# Patient Record
Sex: Male | Born: 1965
Health system: Southern US, Community
[De-identification: ages and names within clinical notes are randomized; demographics above are authoritative.]

## PROBLEM LIST (undated history)

## (undated) DIAGNOSIS — I10 Essential (primary) hypertension: Secondary | ICD-10-CM

## (undated) DIAGNOSIS — Z5189 Encounter for other specified aftercare: Secondary | ICD-10-CM

## (undated) DIAGNOSIS — M659 Synovitis and tenosynovitis, unspecified: Secondary | ICD-10-CM

## (undated) DIAGNOSIS — M7022 Olecranon bursitis, left elbow: Secondary | ICD-10-CM

## (undated) DIAGNOSIS — L408 Other psoriasis: Secondary | ICD-10-CM

## (undated) DIAGNOSIS — M5430 Sciatica, unspecified side: Secondary | ICD-10-CM

## (undated) DIAGNOSIS — J309 Allergic rhinitis, unspecified: Secondary | ICD-10-CM

## (undated) DIAGNOSIS — E785 Hyperlipidemia, unspecified: Secondary | ICD-10-CM

## (undated) HISTORY — DX: Olecranon bursitis, left elbow: M70.22

## (undated) HISTORY — PX: LACERATION REPAIR: SHX5168

## (undated) HISTORY — PX: CATARACT EXTRACTION, BILATERAL: SHX1313

## (undated) HISTORY — DX: Other psoriasis: L40.8

## (undated) HISTORY — DX: Sciatica, unspecified side: M54.30

## (undated) HISTORY — DX: Essential (primary) hypertension: I10

## (undated) HISTORY — DX: Hyperlipidemia, unspecified: E78.5

## (undated) HISTORY — PX: OTHER SURGICAL HISTORY: SHX169

## (undated) HISTORY — DX: Allergic rhinitis, unspecified: J30.9

## (undated) HISTORY — DX: Synovitis and tenosynovitis, unspecified: M65.9

## (undated) HISTORY — PX: EYE SURGERY: SHX253

## (undated) HISTORY — DX: Encounter for other specified aftercare: Z51.89

---

## 2004-09-20 ENCOUNTER — Ambulatory Visit: Payer: Self-pay | Admitting: Internal Medicine

## 2004-09-27 ENCOUNTER — Ambulatory Visit: Payer: Self-pay | Admitting: Internal Medicine

## 2004-10-06 ENCOUNTER — Ambulatory Visit: Payer: Self-pay | Admitting: Internal Medicine

## 2004-12-07 ENCOUNTER — Ambulatory Visit: Payer: Self-pay | Admitting: Internal Medicine

## 2005-01-13 ENCOUNTER — Ambulatory Visit: Payer: Self-pay | Admitting: Internal Medicine

## 2005-02-02 ENCOUNTER — Ambulatory Visit: Payer: Self-pay | Admitting: Internal Medicine

## 2005-02-24 ENCOUNTER — Ambulatory Visit: Payer: Self-pay | Admitting: Internal Medicine

## 2005-04-29 ENCOUNTER — Ambulatory Visit: Payer: Self-pay | Admitting: Internal Medicine

## 2005-05-06 ENCOUNTER — Ambulatory Visit: Payer: Self-pay | Admitting: Internal Medicine

## 2005-08-19 ENCOUNTER — Ambulatory Visit: Payer: Self-pay | Admitting: Internal Medicine

## 2006-11-23 ENCOUNTER — Ambulatory Visit: Payer: Self-pay | Admitting: Internal Medicine

## 2006-11-23 LAB — CONVERTED CEMR LAB
ALT: 39 units/L (ref 0–40)
AST: 23 units/L (ref 0–37)
Basophils Relative: 0 % (ref 0.0–1.0)
Bilirubin, Direct: 0.1 mg/dL (ref 0.0–0.3)
CO2: 28 meq/L (ref 19–32)
Chloride: 105 meq/L (ref 96–112)
Direct LDL: 207 mg/dL
Eosinophils Absolute: 0.1 10*3/uL (ref 0.0–0.6)
Eosinophils Relative: 1.6 % (ref 0.0–5.0)
GFR calc non Af Amer: 98 mL/min
Glucose, Bld: 94 mg/dL (ref 70–99)
HCT: 43.9 % (ref 39.0–52.0)
Lymphocytes Relative: 42.5 % (ref 12.0–46.0)
MCV: 96.4 fL (ref 78.0–100.0)
Neutro Abs: 3.2 10*3/uL (ref 1.4–7.7)
Neutrophils Relative %: 46.6 % (ref 43.0–77.0)
RBC: 4.55 M/uL (ref 4.22–5.81)
Sodium: 140 meq/L (ref 135–145)
Total Protein: 6.8 g/dL (ref 6.0–8.3)
VLDL: 33 mg/dL (ref 0–40)
WBC: 6.7 10*3/uL (ref 4.5–10.5)

## 2006-11-30 ENCOUNTER — Ambulatory Visit: Payer: Self-pay | Admitting: Internal Medicine

## 2007-01-26 ENCOUNTER — Ambulatory Visit: Payer: Self-pay | Admitting: Internal Medicine

## 2007-01-26 LAB — CONVERTED CEMR LAB
AST: 24 units/L (ref 0–37)
Albumin: 4 g/dL (ref 3.5–5.2)
Bilirubin, Direct: 0.1 mg/dL (ref 0.0–0.3)
Direct LDL: 119.5 mg/dL
Total Bilirubin: 0.4 mg/dL (ref 0.3–1.2)
Total Protein: 6.5 g/dL (ref 6.0–8.3)

## 2007-02-02 ENCOUNTER — Ambulatory Visit: Payer: Self-pay | Admitting: Internal Medicine

## 2007-06-15 ENCOUNTER — Ambulatory Visit: Payer: Self-pay | Admitting: Internal Medicine

## 2007-10-31 ENCOUNTER — Ambulatory Visit: Payer: Self-pay | Admitting: Internal Medicine

## 2007-10-31 LAB — CONVERTED CEMR LAB
AST: 25 units/L (ref 0–37)
Albumin: 4 g/dL (ref 3.5–5.2)
Alkaline Phosphatase: 51 units/L (ref 39–117)
BUN: 10 mg/dL (ref 6–23)
Basophils Relative: 0.6 % (ref 0.0–1.0)
Blood in Urine, dipstick: NEGATIVE
Chloride: 106 meq/L (ref 96–112)
Creatinine, Ser: 1 mg/dL (ref 0.4–1.5)
Eosinophils Absolute: 0.1 10*3/uL (ref 0.0–0.6)
Eosinophils Relative: 1.3 % (ref 0.0–5.0)
GFR calc Af Amer: 105 mL/min
Glucose, Bld: 87 mg/dL (ref 70–99)
Glucose, Urine, Semiquant: NEGATIVE
HCT: 43.7 % (ref 39.0–52.0)
HDL: 35.5 mg/dL — ABNORMAL LOW (ref >39.0)
Hemoglobin: 14.9 g/dL (ref 13.0–17.0)
MCV: 96.7 fL (ref 78.0–100.0)
Monocytes Absolute: 0.7 10*3/uL (ref 0.2–0.7)
Monocytes Relative: 8.6 % (ref 3.0–11.0)
Nitrite: NEGATIVE
Platelets: 243 10*3/uL (ref 150–400)
Potassium: 5.4 meq/L — ABNORMAL HIGH (ref 3.5–5.1)
RBC: 4.52 M/uL (ref 4.22–5.81)
Specific Gravity, Urine: 1.02
TSH: 0.91 microintl units/mL (ref 0.35–5.50)
Total CHOL/HDL Ratio: 5.5
Total Protein: 6.2 g/dL (ref 6.0–8.3)
WBC Urine, dipstick: NEGATIVE
WBC: 7.8 10*3/uL (ref 4.5–10.5)
pH: 6

## 2007-11-08 ENCOUNTER — Ambulatory Visit: Payer: Self-pay | Admitting: Internal Medicine

## 2007-11-08 DIAGNOSIS — E785 Hyperlipidemia, unspecified: Secondary | ICD-10-CM

## 2007-11-08 DIAGNOSIS — J309 Allergic rhinitis, unspecified: Secondary | ICD-10-CM | POA: Insufficient documentation

## 2007-11-08 HISTORY — DX: Allergic rhinitis, unspecified: J30.9

## 2007-11-08 HISTORY — DX: Hyperlipidemia, unspecified: E78.5

## 2008-01-15 ENCOUNTER — Ambulatory Visit: Payer: Self-pay | Admitting: Internal Medicine

## 2008-01-15 ENCOUNTER — Telehealth: Payer: Self-pay | Admitting: Internal Medicine

## 2008-02-14 ENCOUNTER — Ambulatory Visit: Payer: Self-pay | Admitting: Internal Medicine

## 2008-02-14 DIAGNOSIS — L408 Other psoriasis: Secondary | ICD-10-CM

## 2008-02-14 DIAGNOSIS — L409 Psoriasis, unspecified: Secondary | ICD-10-CM | POA: Insufficient documentation

## 2008-02-14 HISTORY — DX: Other psoriasis: L40.8

## 2009-01-13 ENCOUNTER — Ambulatory Visit: Payer: Self-pay | Admitting: Internal Medicine

## 2009-01-13 LAB — CONVERTED CEMR LAB
Albumin: 4.2 g/dL (ref 3.5–5.2)
Alkaline Phosphatase: 48 units/L (ref 39–117)
Basophils Relative: 0.2 % (ref 0.0–3.0)
CO2: 30 meq/L (ref 19–32)
Chloride: 109 meq/L (ref 96–112)
Eosinophils Absolute: 0.1 10*3/uL (ref 0.0–0.7)
HCT: 40.9 % (ref 39.0–52.0)
Hemoglobin: 14.5 g/dL (ref 13.0–17.0)
Lymphocytes Relative: 29.8 % (ref 12.0–46.0)
Lymphs Abs: 1.8 10*3/uL (ref 0.7–4.0)
MCHC: 35.5 g/dL (ref 30.0–36.0)
MCV: 94.3 fL (ref 78.0–100.0)
Monocytes Absolute: 0.6 10*3/uL (ref 0.1–1.0)
Neutro Abs: 3.4 10*3/uL (ref 1.4–7.7)
Nitrite: NEGATIVE
Potassium: 4.3 meq/L (ref 3.5–5.1)
RBC: 4.34 M/uL (ref 4.22–5.81)
Sodium: 142 meq/L (ref 135–145)
Specific Gravity, Urine: 1.02
Total CHOL/HDL Ratio: 5
Total Protein: 7.4 g/dL (ref 6.0–8.3)
Urobilinogen, UA: 0.2

## 2009-01-19 ENCOUNTER — Ambulatory Visit: Payer: Self-pay | Admitting: Internal Medicine

## 2009-01-19 DIAGNOSIS — M549 Dorsalgia, unspecified: Secondary | ICD-10-CM | POA: Insufficient documentation

## 2009-01-19 DIAGNOSIS — M5430 Sciatica, unspecified side: Secondary | ICD-10-CM

## 2009-01-19 HISTORY — DX: Sciatica, unspecified side: M54.30

## 2009-04-21 ENCOUNTER — Ambulatory Visit: Payer: Self-pay | Admitting: Internal Medicine

## 2009-04-21 LAB — CONVERTED CEMR LAB
ALT: 47 units/L (ref 0–53)
Cholesterol: 225 mg/dL — ABNORMAL HIGH (ref 0–200)
Total Bilirubin: 0.9 mg/dL (ref 0.3–1.2)
Total CHOL/HDL Ratio: 5
Triglycerides: 289 mg/dL — ABNORMAL HIGH (ref 0.0–149.0)

## 2009-04-28 ENCOUNTER — Ambulatory Visit: Payer: Self-pay | Admitting: Internal Medicine

## 2009-07-29 ENCOUNTER — Ambulatory Visit: Payer: Self-pay | Admitting: Internal Medicine

## 2009-07-29 LAB — CONVERTED CEMR LAB
Bilirubin, Direct: 0.1 mg/dL (ref 0.0–0.3)
HDL: 47 mg/dL (ref >39.00)
Total Bilirubin: 1 mg/dL (ref 0.3–1.2)
VLDL: 48.2 mg/dL — ABNORMAL HIGH (ref 0.0–40.0)

## 2010-02-19 ENCOUNTER — Ambulatory Visit: Payer: Self-pay | Admitting: Internal Medicine

## 2010-02-19 LAB — CONVERTED CEMR LAB
AST: 34 units/L (ref 0–37)
BUN: 24 mg/dL — ABNORMAL HIGH (ref 6–23)
Basophils Absolute: 0 10*3/uL (ref 0.0–0.1)
Bilirubin Urine: NEGATIVE
Blood in Urine, dipstick: NEGATIVE
Calcium: 9.5 mg/dL (ref 8.4–10.5)
Cholesterol: 178 mg/dL (ref 0–200)
Eosinophils Absolute: 0.1 10*3/uL (ref 0.0–0.7)
GFR calc non Af Amer: 104.78 mL/min (ref >60)
Glucose, Bld: 104 mg/dL — ABNORMAL HIGH (ref 70–99)
Glucose, Urine, Semiquant: NEGATIVE
HCT: 41.6 % (ref 39.0–52.0)
HDL: 49.4 mg/dL (ref >39.00)
Ketones, urine, test strip: NEGATIVE
Lymphocytes Relative: 44.1 % (ref 12.0–46.0)
Lymphs Abs: 2.6 10*3/uL (ref 0.7–4.0)
Monocytes Relative: 9.4 % (ref 3.0–12.0)
Platelets: 205 10*3/uL (ref 150.0–400.0)
RDW: 12.7 % (ref 11.5–14.6)
TSH: 0.77 microintl units/mL (ref 0.35–5.50)
Total Bilirubin: 0.6 mg/dL (ref 0.3–1.2)
VLDL: 20.8 mg/dL (ref 0.0–40.0)
pH: 5

## 2010-02-26 ENCOUNTER — Ambulatory Visit: Payer: Self-pay | Admitting: Internal Medicine

## 2010-02-26 DIAGNOSIS — M1A00X0 Idiopathic chronic gout, unspecified site, without tophus (tophi): Secondary | ICD-10-CM | POA: Insufficient documentation

## 2010-03-15 ENCOUNTER — Ambulatory Visit: Payer: Self-pay | Admitting: Sports Medicine

## 2010-03-15 DIAGNOSIS — M659 Unspecified synovitis and tenosynovitis, unspecified site: Secondary | ICD-10-CM | POA: Insufficient documentation

## 2010-03-15 HISTORY — DX: Unspecified synovitis and tenosynovitis, unspecified site: M65.90

## 2010-03-15 HISTORY — DX: Synovitis and tenosynovitis, unspecified: M65.9

## 2010-04-26 ENCOUNTER — Ambulatory Visit: Payer: Self-pay | Admitting: Sports Medicine

## 2010-06-07 ENCOUNTER — Ambulatory Visit: Payer: Self-pay | Admitting: Sports Medicine

## 2010-07-19 ENCOUNTER — Telehealth: Payer: Self-pay | Admitting: Internal Medicine

## 2010-07-20 ENCOUNTER — Encounter: Payer: Self-pay | Admitting: Internal Medicine

## 2010-07-20 ENCOUNTER — Ambulatory Visit: Payer: Self-pay | Admitting: Internal Medicine

## 2010-07-20 DIAGNOSIS — I10 Essential (primary) hypertension: Secondary | ICD-10-CM | POA: Insufficient documentation

## 2010-07-20 HISTORY — DX: Essential (primary) hypertension: I10

## 2010-07-20 LAB — CONVERTED CEMR LAB
CO2: 30 meq/L (ref 19–32)
Chloride: 104 meq/L (ref 96–112)
GFR calc non Af Amer: 106.05 mL/min (ref >60.00)
Glucose, Bld: 102 mg/dL — ABNORMAL HIGH (ref 70–99)
Potassium: 4.5 meq/L (ref 3.5–5.1)
Sodium: 141 meq/L (ref 135–145)

## 2010-07-22 ENCOUNTER — Ambulatory Visit: Payer: Self-pay | Admitting: Sports Medicine

## 2010-09-07 NOTE — Assessment & Plan Note (Signed)
Summary: F/U,MC   Vital Signs:  Patient profile:   45 year old male Pulse rate:   79 / minute BP sitting:   144 / 98  (left arm)  Vitals Entered By: Rochele Pages RN (June 07, 2010 8:52 AM) CC: f/u bilateral elbow pain r>l   Primary Provider:  Stacie Glaze MD  CC:  f/u bilateral elbow pain r>l.  History of Present Illness: Elbow Pain: Left, Resolved completly. Able to lift heavy objects without pain.  Much improved.   Right: About 50% improved since last time. Routine activities such as twist a door knob or opening the gas cap of his car no longer cause pain. However lifting and carying heavy objects will cause elbow and forearm pain.  He continues to use NTG patches and his exercise activities as directed. 2lbs on right and 3lbs on left. Occasional pain with weights on right.   Preventive Screening-Counseling & Management  Alcohol-Tobacco     Smoking Status: quit in 2009     Year Quit: 2009     Passive Smoke Exposure: yes  Current Problems (verified): 1)  Elevated Bp Reading Without Dx Hypertension  (ICD-796.2) 2)  Tendinitis, Left Elbow  (ICD-727.00) 3)  Cerumen Impaction, Right  (ICD-380.4) 4)  Chronic Gouty Arthropathy w/o Mention Tophus  (ICD-274.02) 5)  Epicondylitis, Bilateral  (ICD-726.32) 6)  Sciatica, Chronic  (ICD-724.3) 7)  Psoriasis, Scalp  (ICD-696.1) 8)  Gouty Arthropathy  (ICD-274.0) 9)  Allergic Rhinitis  (ICD-477.9) 10)  Hyperlipidemia  (ICD-272.4) 11)  Preventive Health Care  (ICD-V70.0)  Current Medications (verified): 1)  Simvastatin 40 Mg Tabs (Simvastatin) .... One By Mouth Day 2)  Aleve 220 Mg  Caps (Naproxen Sodium) .... As Needed 3)  Nitroglycerin 0.2 Mg/hr Pt24 (Nitroglycerin) .... 1/4 Patch Once Daily To Rt Elbow  Allergies (verified): No Known Drug Allergies  Past History:  Past Medical History: Last updated: 11/08/2007 Hyperlipidemia Allergic rhinitis chickenpox childhood  Past Surgical History: Last updated:  11/08/2007 tendon repair at age 89 laceration repair at 15 ear pinning  Social History: Last updated: 11/08/2007 Married Former Smoker Alcohol use-yes Drug use-no Regular exercise-yes  Risk Factors: Smoking Status: quit in 2009 (06/07/2010) Passive Smoke Exposure: yes (06/07/2010)  Review of Systems  The patient denies anorexia, fever, weight loss, chest pain, and abdominal pain.    Physical Exam  General:  Vs noted and rechecked. Well NAD Msk:  Right Elbow: Normal appearing, no swelling or skin changes.  Normal ROM without pain. Non tender to palpation along the radial head or lateral or medial epicondyls.   Holding a heavy book with an outstretched arm causes pain in the lateral epicondyl.  Additonally resisted wrist extension and ulnar deviation cause some mild pain in lateral epicondyl and along the ulnar forearm.  MSK [place]: Left: Relativly normal appearing for age. Neovessles resolved.   Right: Continued calcifications and hypoechoic changes in the ski slope of the lateral epicondyl tendons. However less than was last time when compaired to prior view. Neovessles are 50% improved from prior study.    Impression & Recommendations:  Problem # 1:  EPICONDYLITIS, BILATERAL (ICD-726.32) Assessment Improved  Improving in right and essentailly resolved on left.  Impression following [place] and exam is improving but not completly healed tennis elbow.  Pain in forearm is likely referred.  Plan to continue PT exercises BL increasing weight to goal of 5lbs. Pain as guide.  Will also continue NTG patches on right as neovessles still present.  Will follow up in  6 weeks. Pt voices understanding.   Orders: [place] LIMITED ([id number])  Problem # 2:  ELEVATED BP READING WITHOUT DX HYPERTENSION (ICD-796.2) Assessment: New BP elevated today and on recheck.  Will have patient go home and perform multiple checks at drug store. Will reccord them. Will follow up in 6 weeks.  red flags  reviewed. Pt voices understanding.   Complete Medication List: 1)  Simvastatin 40 Mg Tabs (Simvastatin) .... One by mouth day 2)  Aleve 220 Mg Caps (Naproxen sodium) .... As needed 3)  Nitroglycerin 0.2 Mg/hr Pt24 (Nitroglycerin) .... 1/4 patch once daily to rt elbow  Patient Instructions: 1)  Thank you for seeing me today. 2)  Please continue the NTG patches as directed.  3)  Continue to advance exercise and weight on left side.  4)  On right keep doing 2 lbs until pain free and then advance to 3lbs.  5)  Let pain be your guide.  >3/10 is too much.  6)  Return in 6 weeks.  7)  Please measure your blood pressure at the pharmacy a few times between now and 6 weeks. Write the numbers down.    Orders Added: 1)  Est. Patient Level III [98119] 2)  [place] LIMITED [[id number]]

## 2010-09-07 NOTE — Letter (Signed)
Summary: *Consult Note  [facility]  [address]   [place], [place] [address]   Phone: [phone]  Fax: [phone]    Re:    Cody Woodard DOB:    1965-12-22 Darryll Capers, MD [facility] 03/15/10   Dear [name]:    Thank you for requesting that we see the above patient for consultation.  A copy of the detailed office note will be sent under separate cover, for your review.  Evaluation today is consistent with:  1)  TENDINITIS, LEFT ELBOW (ICD-727.00) 2)  ELBOW PAIN, BILATERAL (ICD-719.42)   Our recommendation is for: Ntg to treat an area of tearing that has failed to heal in RT lateral epicondyle.  This is thought to bring stem cell migratin to area to affect repair. (Paolini articles are good.)  Will also use a home exercise program for rehabiliatation and follow.  This will likely take 3 to 6 months to heal.   New Orders include:  1)  Consultation Level III [99243] 2)  [place] LIMITED [[id number]]   New Medications started today include:  1)  NITROGLYCERIN 0.2 MG/HR PT24 (NITROGLYCERIN) 1/4 patch once daily to RT elbow   After today's visit, the patients current medications include: 1)  SIMVASTATIN 40 MG TABS (SIMVASTATIN) one by mouth day 2)  ALEVE 220 MG  CAPS (NAPROXEN SODIUM) as needed 3)  NITROGLYCERIN 0.2 MG/HR PT24 (NITROGLYCERIN) 1/4 patch once daily to RT elbow   Thank you for this consultation.  If you have any further questions regarding the care of this patient, please do not hesitate to contact me @ [phone].  Thank you for this opportunity to look after your patient.  Sincerely,  Vincent Gros MD

## 2010-09-07 NOTE — Assessment & Plan Note (Signed)
Summary: NP,B ELBOW PAIN X ONE YEAR   Vital Signs:  Patient profile:   45 year old male BP sitting:   136 / 98  Vitals Entered By: Lillia Pauls CMA (March 15, 2010 10:00 AM)  Primary Provider:  Stacie Glaze MD   History of Present Illness: Cody Woodard is a 45 year old male who presents today with a one year history of elbow pain, right worse than left. He recalls that the pain began after doing a lot of work on his deck. He now feels the pain when he is lifting or carrying something with a flexed elbow and while trying to open jars with the right hand. He localizes the pain in the right elbow to the lateral side and in the left to the posterior aspect. He did receive a corticosteroid injection from his PCP in the right elbow, which helped for about two weeks, but his pain returned with return to activity.  The patient denies any radiating pain, tingling, or numbness. He has not tried any bracing, exercises, or icing. He takes naproxen occasionally for the pain.  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 He has stopped playing golf, which he previously did on occasion. He does not play any other sports.  Left elbow probably had some trauma in sports while young. has Hx of golfball like swelling at olecranon that occurs with too much lifting or activity.  Pain pattern more posterior but did have some lesser pain laterally.  Allergies: No Known Drug Allergies  Physical Exam  General:  Well-developed,well-nourished,in no acute distress; alert,appropriate and cooperative throughout examination Msk:  Right Elbow: No tenderness to palpation over the elbow, nor erythema or edema. Full flexion and extension range of motion. Strength on flexion, extension, supination and pronation are 5/5 though pronation does induce some pain. Cannot hold a book in full extension of arm 2/2 oaun  Left Elbow: No tenderness to palpation over the elbow, nor  erythema or edema. Full flexion and extension range of motion. Strength on flexion, extension, supination and pronation are 5/5 though extension against force does induce some pain.  Right wrist: Full range of motion on flexion and extension. Strength on flexion and extension are 5/5, though extension causes pain at the elbow  Left wrist: Full range of motion on flexion and extension. Strength on flexion and extension are 5/5, without pain.  Hands: finger abduction and adduction strength is 5/5  Additional Exam:  Ultrasound:  Right elbow: 2 bone spurs visible on lateral epicondyle with calcification and scarring in tendon tissue. Neovessel visible on doppler. Edema with a pocket of fluid is visible near lateral epicondyle. Insertion of triceps appears normal.  Left eblow:spur visible at insertion of triceps tendon with some increased blood flow and edema. Olecrenon bursa visualized without enlargement. Lateral epicondyle shows slight irregularity without evidence of significant edema or neovascularization.  Images saved.   Impression & Recommendations:  Problem # 1:  ELBOW PAIN, BILATERAL (ICD-719.42)  The patient presents with bilateral elbow pain of 1 year duration that appears to be secondary to two different processes, a lateral epicondylitis in the right elbow and irritation at the insertion of the triceps tendon on the left. As the right elbow is more painful, we will treat it more aggresively with nitroglycerine patches and an exercise program. The left elbow will be addressed using a triceps exercise program. He is to avoid any activities (eg. golf, tennis) that  exacerbate his pain at this point and is to return in six weeks for re-evaluation.  Orders: [place] LIMITED ([id number])  Problem # 2:  EPICONDYLITIS, BILATERAL (ICD-726.32)  The patient has symptoms, signs and ultrasound evidence of significant chronic lateral epicondylitis, right elbow worse than left. The patient will use  nitroglycerine patches on the right elbow to increase healing and will complete right wrist curls and wrist rolls to strengthen the wrist muscles. Will not use a brace at this point as the patient does not participate in vigerous activity (eg. tennis) that exacerbates the pain. The patient is to reurn in six weeks to evaluate for improvement.  Orders: [place] LIMITED ([id number])  Problem # 3:  TENDINITIS, LEFT ELBOW (ICD-727.00)  The patient's pain in his left elbow is localized to the posterio aspect of the elbow, which is consistent with the spurs found at the insertion of the triceps tendon. We will treat these symptoms using triceps extension exercises. As there was some slight ultrasound evidence of a lateral epicondylitis, the patient is also to do his wrist curls and rolls on the left side (1 set of 15 daily). He is to return to clinic in 6 weeks to evaluate for improvement.  Orders: [place] LIMITED ([id number])  Complete Medication List: 1)  Simvastatin 40 Mg Tabs (Simvastatin) .... One by mouth day 2)  Aleve 220 Mg Caps (Naproxen sodium) .... As needed 3)  Nitroglycerin 0.2 Mg/hr Pt24 (Nitroglycerin) .... 1/4 patch once daily to rt elbow  Patient Instructions: 1)  1. Right side: 2)  1 pound weight in right hand with elbow extended, palm down 3)  wrist curls: down slow up fast 4)  wrist rolls: over slow back fast 5)  3 sets of 15 6)  2. Left side:  7)  1 set of 15 of wrist exercises as above 8)  1 pound weight in left hand with elbow extended 9)  Triceps extension: extend elbow out slow, back curled in fast  10)  3 sets of 15 11)  3. Nitroglycerine patches: use 1/4 over right elbow, change each day 12)  4. Avoid activities that irritate your elbows. 13)  5. Return to clinic in 6 weeks to re-scan and look for improvement. Prescriptions: NITROGLYCERIN 0.2 MG/HR PT24 (NITROGLYCERIN) 1/4 patch once daily to RT elbow  #30 x 1   Entered and Authorized by:   Enid Baas MD   Signed by:   Enid Baas  MD on 03/15/2010   Method used:   Electronically to        [facility] # [id number]* (retail)       [address]       [place], [place]  [address]       Ph: [phone]       Fax: [phone]   RxID:   [id number]

## 2010-09-07 NOTE — Assessment & Plan Note (Signed)
Summary: cpx/mm   Vital Signs:  Patient profile:   45 year old male Height:      72 inches Weight:      205 pounds BMI:     27.90 Temp:     98.2 degrees F oral Pulse rate:   72 / minute Resp:     14 per minute BP sitting:   140 / 90  (left arm)  Vitals Entered By: Willy Eddy, LPN (February 26, 2010 8:32 AM) CC: cpx Is Patient Diabetic? No   Primary Care Provider:  Stacie Glaze MD  CC:  cpx.  History of Present Illness: the pt has been on zocor with good results The pt was asked about all immunizations, health maint. services that are appropriate to their age and was given guidance on diet exercize  and weight management  has persistant bilateral epicodylitis right greater that left had an injection on the right that helped has not worn braces  wax in right ear with eharing loss sebborhea  Preventive Screening-Counseling & Management  Alcohol-Tobacco     Smoking Status: quit in 2009     Year Quit: 2009     Passive Smoke Exposure: yes  Problems Prior to Update: 1)  Sciatica, Chronic  (ICD-724.3) 2)  Psoriasis, Scalp  (ICD-696.1) 3)  Gouty Arthropathy  (ICD-274.0) 4)  Allergic Rhinitis  (ICD-477.9) 5)  Hyperlipidemia  (ICD-272.4) 6)  Preventive Health Care  (ICD-V70.0)  Current Problems (verified): 1)  Sciatica, Chronic  (ICD-724.3) 2)  Psoriasis, Scalp  (ICD-696.1) 3)  Gouty Arthropathy  (ICD-274.0) 4)  Allergic Rhinitis  (ICD-477.9) 5)  Hyperlipidemia  (ICD-272.4) 6)  Preventive Health Care  (ICD-V70.0)  Medications Prior to Update: 1)  Simvastatin 40 Mg Tabs (Simvastatin) .... One By Mouth Day 2)  Aleve 220 Mg  Caps (Naproxen Sodium) .... As Needed  Current Medications (verified): 1)  Simvastatin 40 Mg Tabs (Simvastatin) .... One By Mouth Day 2)  Aleve 220 Mg  Caps (Naproxen Sodium) .... As Needed  Allergies (verified): No Known Drug Allergies  Past History:  Family History: Last updated: 11/08/2007 adopted  Social History: Last  updated: 11/08/2007 Married Former Smoker Alcohol use-yes Drug use-no Regular exercise-yes  Risk Factors: Exercise: yes (11/08/2007)  Risk Factors: Smoking Status: quit in 2009 (02/26/2010) Passive Smoke Exposure: yes (02/26/2010)  Past medical, surgical, family and social histories (including risk factors) reviewed, and no changes noted (except as noted below).  Past Medical History: Reviewed history from 11/08/2007 and no changes required. Hyperlipidemia Allergic rhinitis chickenpox childhood  Past Surgical History: Reviewed history from 11/08/2007 and no changes required. tendon repair at age 81 laceration repair at 11 ear pinning  Family History: Reviewed history from 11/08/2007 and no changes required. adopted  Social History: Reviewed history from 11/08/2007 and no changes required. Married Former Smoker Alcohol use-yes Drug use-no Regular exercise-yes  Review of Systems  The patient denies anorexia, fever, weight loss, weight gain, vision loss, decreased hearing, hoarseness, chest pain, syncope, dyspnea on exertion, peripheral edema, prolonged cough, headaches, hemoptysis, abdominal pain, melena, hematochezia, severe indigestion/heartburn, hematuria, incontinence, genital sores, muscle weakness, suspicious skin lesions, transient blindness, difficulty walking, depression, unusual weight change, abnormal bleeding, enlarged lymph nodes, angioedema, and breast masses.    Physical Exam  General:  Well-developed,well-nourished,in no acute distress; alert,appropriate and cooperative throughout examination Head:  Normocephalic and atraumatic without obvious abnormalities. No apparent alopecia or balding. Eyes:  pupils equal and pupils round.   Ears:  R ear normal and L ear normal.  Nose:  no external deformity and no nasal discharge.   Neck:  No deformities, masses, or tenderness noted. Lungs:  normal respiratory effort, no intercostal retractions, and no  accessory muscle use.   Heart:  normal rate and no murmur.   Abdomen:  soft, non-tender, and no distention.   Rectal:  normal sphincter tone and external hemorrhoid(s).   Prostate:  no gland enlargement and no nodules.   Msk:  no joint swelling and no joint warmth.   Pulses:  R and L carotid,radial,femoral,dorsalis pedis and posterior tibial pulses are full and equal bilaterally Extremities:  No clubbing, cyanosis, edema, or deformity noted with normal full range of motion of all joints.   Neurologic:  alert & oriented X3 and gait normal.   Skin:  Intact without suspicious lesions or rashes Cervical Nodes:  No lymphadenopathy noted Axillary Nodes:  No palpable lymphadenopathy Psych:  Oriented X3.     Impression & Recommendations:  Problem # 1:  EPICONDYLITIS, BILATERAL (ICD-726.32) Assessment New  refer to sports medicine [facility] check gout/ uric acid levels due to relations ship with chronic arthropathies  Orders: Misc. Referral (Misc. Ref)  Problem # 2:  PREVENTIVE HEALTH CARE (ICD-V70.0) The pt was asked about all immunizations, health maint. services that are appropriate to their age and was given guidance on diet exercize  and weight management  Td Booster: Historical (01/06/2001)   Chol: 178 (02/19/2010)   HDL: 49.40 (02/19/2010)   LDL: 108 (02/19/2010)   TG: 104.0 (02/19/2010) TSH: 0.77 (02/19/2010)    Discussed using sunscreen, use of alcohol, drug use, self testicular exam, routine dental care, routine eye care, routine physical exam, seat belts, multiple vitamins, osteoporosis prevention, adequate calcium intake in diet, and recommendations for immunizations.  Discussed exercise and checking cholesterol.  Discussed gun safety, safe sex, and contraception. Also recommend checking PSA.  Problem # 3:  HYPERLIPIDEMIA (ICD-272.4)  His updated medication list for this problem includes:    Simvastatin 40 Mg Tabs (Simvastatin) ..... One by mouth day  Labs Reviewed: SGOT:  34 (02/19/2010)   SGPT: 51 (02/19/2010)   HDL:49.40 (02/19/2010), 47.00 (07/29/2009)  LDL:108 (02/19/2010), 120 (10/31/2007)  Chol:178 (02/19/2010), 202 (07/29/2009)  Trig:104.0 (02/19/2010), 241.0 (07/29/2009)  Problem # 4:  CHRONIC GOUTY ARTHROPATHY W/O MENTION TOPHUS (ICD-274.02) monitering uric acid Orders: Venipuncture ([id number]) TLB-Uric Acid, Blood (84550-URIC)  Elevate extremity; warm compresses, symptomatic relief and medication as directed.   Problem # 5:  CERUMEN IMPACTION, RIGHT (ICD-380.4) informed consnet obtained, using a cerumin spoon the wax impaction was dislodged and the canal was lavaged with 1/2 peroxide and 1/2 warm water solution until clear  Complete Medication List: 1)  Simvastatin 40 Mg Tabs (Simvastatin) .... One by mouth day 2)  Aleve 220 Mg Caps (Naproxen sodium) .... As needed  Patient Instructions: 1)  Please schedule a follow-up appointment in 6 months. Prescriptions: SIMVASTATIN 40 MG TABS (SIMVASTATIN) one by mouth day  #30 x 11   Entered by:   Willy Eddy, LPN   Authorized by:   Stacie Glaze MD   Signed by:   Willy Eddy, LPN on 46/96/2952   Method used:   Electronically to        [facility] # [address]* (retail)       [address]       [place], [place]  [address]       Ph: [phone]       Fax: [phone]   RxID:   [id number]

## 2010-09-07 NOTE — Assessment & Plan Note (Signed)
Summary: U/S  ELBOW,MC   Vital Signs:  Patient profile:   45 year old male BP sitting:   160 / 100  Vitals Entered By: Lillia Pauls CMA (April 26, 2010 8:39 AM)  Primary Provider:  Stacie Glaze MD   History of Present Illness: RT elbow - making progress and about 30% better in regards to pain able to do ecc exercise w 1 lb using NTG without probs on RT only no elbow strap  LT elbow at least 50% better and days w no pain 1 lb is easy on left  Allergies: No Known Drug Allergies  Physical Exam  General:  Well-developed,well-nourished,in no acute distress; alert,appropriate and cooperative throughout examination Msk:  RT elbow full ROM TTP at tip of epi pain with resisted finger or wrist in conc range also in ecc range  Left elbow minimal pain with conc or ecc testing full ROM and some hyperextension has sing of old olecranon bursal swelling no TTP  BOOK - on RT slt pain/ on left no pain Additional Exam:  MSK [place] RT definite improvement in appearance of tendon - less hypechoic calcificatiion is less Neovessels are still present and unchanged  LT tendon looks essentially normal now 1 new neovessel noted some chronic changes as before but less impressive triceps tendon show old avulsion Fx   Impression & Recommendations:  Problem # 1:  EPICONDYLITIS, BILATERAL (ICD-726.32)  cont NTG protocol on RT cont exercises bilat increase to 2 lbs on left and wait to do so on RT until at least 50% better reck 6 wks repeat US then  Orders: [place] LIMITED ([id number])  Complete Medication List: 1)  Simvastatin 40 Mg Tabs (Simvastatin) .... One by mouth day 2)  Aleve 220 Mg Caps (Naproxen sodium) .... As needed 3)  Nitroglycerin 0.2 Mg/hr Pt24 (Nitroglycerin) .... 1/4 patch once daily to rt elbow

## 2010-09-09 NOTE — Assessment & Plan Note (Signed)
Summary: F/U U/S [facility]   Primary Provider:  Stacie Glaze MD  CC:  f/u lateral epicondylitis.  History of Present Illness: [name] for f/u of b/l lateral epicondylitis Symptoms resolved on left, no longer doing exercises.  Not having any pain. Rt side is 50% better from last visit. Doing exercises with 2-lb weight about every other day. Still with pain along Rt lateral elbow & into forearm.   Feels strength is improving. Denies numbness/tingling. Hx of trauma to flexor compartment of Rt forearm when put hand thru window when younger.  Had surgical repair.  Allergies: No Known Drug Allergies  Review of Systems      See HPI  Physical Exam  General:  Well-developed,well-nourished,in no acute distress; alert,appropriate and cooperative throughout examination Msk:  - Lt elbow: FROM without pain.  small amount of swelling of olecranon bursa, area non-tender to palpation.  No erythema.  Minimal TTP over lateral epicondyle, no TTP over medial epicondyle.  No pain at lateral epicondyle with resisted wrist extension or with book test.  - Rt elbow: FROM without pain.  No swelling or bruising.  TTP along lateral epicondyle & along extensor bundle, but less than previous evaluation. Pain at lateral epicondyle with resisted wrist extension, some pain with book test. Pulses:  +2/4 radial b/l Neurologic:  sensation intact to light touch.   Additional Exam:  MSK U/S:  - Left elbow:  tendon appears normal.  Chronic changes as noted on prior scans.  No significant neovessels noted.  No surrounding edema.   - Right elbow: continued calcification along lateral epicondyle with ski slope sign.  Hypoechoic signal at lateral epicondyle consistent with edema.  Less edema than last visit, no visible tears.  Neovessels again present, but less than last scan. Overall improved from previous scan. - Images saved.   Impression & Recommendations:  Problem # 1:  EPICONDYLITIS, BILATERAL  (ICD-726.32) Assessment Improved  - Symptoms resolved on left - Improving on right.  MSK u/s shows decreased neovessels today & overall improvement. - Cont. NTG 1/4 patch on Rt elbow, refill given.  Likely will tx for approach 61-months with NTG. - Cont. exercises on right - emphasized need to do daily. - f/u 2 months for re-evaluation & repeat u/s, may return sooner if needed.  Orders: [place] LIMITED ([id number])  Complete Medication List: 1)  Simvastatin 40 Mg Tabs (Simvastatin) .... One by mouth day 2)  Aleve 220 Mg Caps (Naproxen sodium) .... As needed 3)  Nitroglycerin 0.2 Mg/hr Pt24 (Nitroglycerin) .... 1/4 patch once daily to rt elbow 4)  Bisoprolol Fumarate 5 Mg Tabs (Bisoprolol fumarate) .... 1/2 by mouth daily... moniter blood pressure if readings above  140/90 increased to one whole tablet  Patient Instructions: 1)  Please continue the NTG patches as directed.  2)  Continue exercises on right side - important to do every day. 3)  Follow-up in 38-months. Prescriptions: NITROGLYCERIN 0.2 MG/HR PT24 (NITROGLYCERIN) 1/4 patch once daily to RT elbow  #30 x 1   Entered and Authorized by:   Darene Lamer MD   Signed by:   Darene Lamer MD on 07/22/2010   Method used:   Electronically to        [facility] # [address]* (retail)       [address]       [place], [place]  [address]       Ph: [phone]       Fax: [phone]   RxID:  [id number]    Orders Added: 1)  [place] LIMITED [[id number]] 2)  Est. Patient Level III [08657]

## 2010-09-09 NOTE — Progress Notes (Signed)
Summary: REQUEST FOR APPT?  Phone Note Call from Patient   Caller: Patient  [phone] Summary of Call: Pt called in to schedule an appt with Dr Lovell Sheehan ref HTN concerns.... Pt adv that his b/p has been running around  180/108 .Marland KitchenMarland KitchenMarland KitchenMarland KitchenPt denies any h/a, dizziness.... He does feel anxious from time to time, like his pulse / HR  is pounding.... Pt was offered appt with another physician but he adv he would rather f/u with Dr Lovell Sheehan.... Can you advise date / time that pt can be seen?    # [id number].  Initial call taken by: Debbra Riding,  July 19, 2010 12:29 PM  Follow-up for Phone Call        appointment given Follow-up by: Willy Eddy, LPN,  July 19, 2010 12:37 PM

## 2010-09-09 NOTE — Assessment & Plan Note (Signed)
Summary: elevated bp/bmw   Vital Signs:  Patient profile:   45 year old male Height:      72 inches Weight:      212 pounds BMI:     28.86 Temp:     98.2 degrees F oral Pulse rate:   76 / minute Resp:     14 per minute BP sitting:   160 / 100  (left arm)  Vitals Entered By: Willy Eddy, LPN (July 20, 2010 8:55 AM) CC: c/o elevated blood pessure when seeing orthopedist , Hypertension Management, Lipid Management Is Patient Diabetic? No   Primary Care Provider:  Stacie Glaze MD  CC:  c/o elevated blood pessure when seeing orthopedist , Hypertension Management, and Lipid Management.  History of Present Illness: no hx of head ache , or chest pain some neck pressure feels HR is increased not smoking new diagnosis weigth gain noted after stopping smoking no cough hx of hyperlipidemia  Hypertension History:      He denies headache, chest pain, palpitations, dyspnea with exertion, orthopnea, PND, peripheral edema, visual symptoms, neurologic problems, syncope, and side effects from treatment.  new.        Positive major cardiovascular risk factors include male age 89 years old or older, hyperlipidemia, and hypertension.  Negative major cardiovascular risk factors include no history of diabetes, negative family history for ischemic heart disease, and non-tobacco-user status.        Further assessment for target organ damage reveals no history of ASHD, cardiac end-organ damage (CHF/LVH), stroke/TIA, peripheral vascular disease, renal insufficiency, or hypertensive retinopathy.    Lipid Management History:      Positive NCEP/ATP III risk factors include male age 28 years old or older and hypertension.  Negative NCEP/ATP III risk factors include non-diabetic, no family history for ischemic heart disease, non-tobacco-user status, no ASHD (atherosclerotic heart disease), no prior stroke/TIA, and no peripheral vascular disease.      Preventive Screening-Counseling &  Management  Alcohol-Tobacco     Smoking Status: quit     Year Quit: 2009     Passive Smoke Exposure: yes  Problems Prior to Update: 1)  Elevated Bp Reading Without Dx Hypertension  (ICD-796.2) 2)  Tendinitis, Left Elbow  (ICD-727.00) 3)  Cerumen Impaction, Right  (ICD-380.4) 4)  Chronic Gouty Arthropathy w/o Mention Tophus  (ICD-274.02) 5)  Epicondylitis, Bilateral  (ICD-726.32) 6)  Sciatica, Chronic  (ICD-724.3) 7)  Psoriasis, Scalp  (ICD-696.1) 8)  Gouty Arthropathy  (ICD-274.0) 9)  Allergic Rhinitis  (ICD-477.9) 10)  Hyperlipidemia  (ICD-272.4) 11)  Preventive Health Care  (ICD-V70.0)  Current Problems (verified): 1)  Elevated Bp Reading Without Dx Hypertension  (ICD-796.2) 2)  Tendinitis, Left Elbow  (ICD-727.00) 3)  Cerumen Impaction, Right  (ICD-380.4) 4)  Chronic Gouty Arthropathy w/o Mention Tophus  (ICD-274.02) 5)  Epicondylitis, Bilateral  (ICD-726.32) 6)  Sciatica, Chronic  (ICD-724.3) 7)  Psoriasis, Scalp  (ICD-696.1) 8)  Gouty Arthropathy  (ICD-274.0) 9)  Allergic Rhinitis  (ICD-477.9) 10)  Hyperlipidemia  (ICD-272.4) 11)  Preventive Health Care  (ICD-V70.0)  Medications Prior to Update: 1)  Simvastatin 40 Mg Tabs (Simvastatin) .... One By Mouth Day 2)  Aleve 220 Mg  Caps (Naproxen Sodium) .... As Needed 3)  Nitroglycerin 0.2 Mg/hr Pt24 (Nitroglycerin) .... 1/4 Patch Once Daily To Rt Elbow  Current Medications (verified): 1)  Simvastatin 40 Mg Tabs (Simvastatin) .... One By Mouth Day 2)  Aleve 220 Mg  Caps (Naproxen Sodium) .... As Needed 3)  Nitroglycerin 0.2 Mg/hr  Pt24 (Nitroglycerin) .... 1/4 Patch Once Daily To Rt Elbow  Allergies (verified): No Known Drug Allergies  Past History:  Family History: Last updated: 11/08/2007 adopted  Social History: Last updated: 11/08/2007 Married Former Smoker Alcohol use-yes Drug use-no Regular exercise-yes  Risk Factors: Exercise: yes (11/08/2007)  Risk Factors: Smoking Status: quit  (07/20/2010) Passive Smoke Exposure: yes (07/20/2010)  Past medical, surgical, family and social histories (including risk factors) reviewed, and no changes noted (except as noted below).  Past Medical History: Reviewed history from 11/08/2007 and no changes required. Hyperlipidemia Allergic rhinitis chickenpox childhood  Past Surgical History: Reviewed history from 11/08/2007 and no changes required. tendon repair at age 49 laceration repair at 62 ear pinning  Family History: Reviewed history from 11/08/2007 and no changes required. adopted  Social History: Reviewed history from 11/08/2007 and no changes required. Married Former Smoker Alcohol use-yes Drug use-no Regular exercise-yes Smoking Status:  quit  Review of Systems  The patient denies anorexia, fever, weight loss, weight gain, vision loss, decreased hearing, hoarseness, chest pain, syncope, dyspnea on exertion, peripheral edema, prolonged cough, headaches, hemoptysis, abdominal pain, melena, hematochezia, severe indigestion/heartburn, hematuria, incontinence, genital sores, muscle weakness, suspicious skin lesions, transient blindness, difficulty walking, depression, unusual weight change, abnormal bleeding, enlarged lymph nodes, angioedema, and breast masses.    Physical Exam  General:  Vs noted and rechecked. Well NAD Head:  Normocephalic and atraumatic without obvious abnormalities. No apparent alopecia or balding. Eyes:  pupils equal and pupils round.   Ears:  R ear normal and L ear normal.   Nose:  no external deformity and no nasal discharge.   Neck:  No deformities, masses, or tenderness noted. Chest Wall:  no deformities and pectus excavatum.   Lungs:  normal respiratory effort, no intercostal retractions, and no accessory muscle use.   Heart:  regular rhythm, no murmur, and tachycardia.   Abdomen:  soft, non-tender, and no distention.  no bruits   Impression & Recommendations:  Problem # 1:   HYPERTENSION, ESSENTIAL, UNCONTROLLED (ICD-401.9) Assessment New  new diagnosis will obtain EKG and labs to included TSH, Bmet and  CBC  BP today: 160/100 Prior BP: 144/98 (06/07/2010)  10 Yr Risk Heart Disease: 11 %  Labs Reviewed: K+: 4.6 (02/19/2010) Creat: : 0.8 (02/19/2010)   Chol: 178 (02/19/2010)   HDL: 49.40 (02/19/2010)   LDL: 108 (02/19/2010)   TG: 104.0 (02/19/2010)  Orders: Venipuncture (11914) TLB-BMP (Basic Metabolic Panel-BMET) (80048-METABOL) TLB-TSH (Thyroid Stimulating Hormone) (84443-TSH)  His updated medication list for this problem includes:    Bisoprolol Fumarate 5 Mg Tabs (Bisoprolol fumarate) .Marland Kitchen... 1/2 by mouth daily... moniter blood pressure if readings above  140/90 increased to one whole tablet  Problem # 2:  HYPERLIPIDEMIA (ICD-272.4) Assessment: Unchanged  His updated medication list for this problem includes:    Simvastatin 40 Mg Tabs (Simvastatin) ..... One by mouth day  Labs Reviewed: SGOT: 34 (02/19/2010)   SGPT: 51 (02/19/2010)  Lipid Goals: Chol Goal: 200 (07/20/2010)   HDL Goal: 40 (07/20/2010)   LDL Goal: 130 (07/20/2010)   TG Goal: 150 (07/20/2010)  10 Yr Risk Heart Disease: 11 %   HDL:49.40 (02/19/2010), 47.00 (07/29/2009)  LDL:108 (02/19/2010), 120 (10/31/2007)  Chol:178 (02/19/2010), 202 (07/29/2009)  Trig:104.0 (02/19/2010), 241.0 (07/29/2009)  Orders: TLB-TSH (Thyroid Stimulating Hormone) (84443-TSH)  Complete Medication List: 1)  Simvastatin 40 Mg Tabs (Simvastatin) .... One by mouth day 2)  Aleve 220 Mg Caps (Naproxen sodium) .... As needed 3)  Nitroglycerin 0.2 Mg/hr Pt24 (Nitroglycerin) .... 1/4 patch  once daily to rt elbow 4)  Bisoprolol Fumarate 5 Mg Tabs (Bisoprolol fumarate) .... 1/2 by mouth daily... moniter blood pressure if readings above  140/90 increased to one whole tablet  Hypertension Assessment/Plan:      The patient's hypertensive risk group is category B: At least one risk factor (excluding diabetes) with no  target organ damage.  His calculated 10 year risk of coronary heart disease is 11 %.  Today's blood pressure is 160/100.  His blood pressure goal is < 140/90.  Lipid Assessment/Plan:      Based on NCEP/ATP III, the patient's risk factor category is "2 or more risk factors and a calculated 10 year CAD risk of < 20%".  The patient's lipid goals are as follows: Total cholesterol goal is 200; LDL cholesterol goal is 130; HDL cholesterol goal is 40; Triglyceride goal is 150.    Patient Instructions: 1)  Please schedule a follow-up appointment in 2 months. may use SDA Prescriptions: BISOPROLOL FUMARATE 5 MG TABS (BISOPROLOL FUMARATE) 1/2 by mouth daily... moniter blood pressure if readings above  140/90 increased to one whole tablet  #30 x 2   Entered and Authorized by:   Stacie Glaze MD   Signed by:   Stacie Glaze MD on 07/20/2010   Method used:   Electronically to        [facility] [address] (retail)       [address]       [place], [place]  [address]       Ph: [phone]       Fax: [phone]   RxID:   [id number]    Orders Added: 1)  Est. Patient Level IV [88416] 2)  Venipuncture [60630] 3)  TLB-BMP (Basic Metabolic Panel-BMET) [80048-METABOL] 4)  TLB-TSH (Thyroid Stimulating Hormone) [16010-XNA]  Appended Document: Orders Update    Clinical Lists Changes  Orders: Added new Service order of Specimen Handling ([id number]) - Signed      Appended Document: Orders Update    Clinical Lists Changes  Orders: Added new Service order of EKG w/ Interpretation (93000) - Signed

## 2010-09-13 ENCOUNTER — Ambulatory Visit: Payer: Self-pay | Admitting: Internal Medicine

## 2010-09-16 ENCOUNTER — Encounter: Payer: Self-pay | Admitting: Internal Medicine

## 2010-09-21 ENCOUNTER — Encounter: Payer: Self-pay | Admitting: Internal Medicine

## 2010-09-21 ENCOUNTER — Ambulatory Visit (INDEPENDENT_AMBULATORY_CARE_PROVIDER_SITE_OTHER): Payer: 59 | Admitting: Internal Medicine

## 2010-09-21 VITALS — BP 120/84 | HR 68 | Temp 98.1°F | Resp 14 | Ht 72.0 in | Wt 212.0 lb

## 2010-09-21 DIAGNOSIS — I10 Essential (primary) hypertension: Secondary | ICD-10-CM

## 2010-09-21 DIAGNOSIS — M549 Dorsalgia, unspecified: Secondary | ICD-10-CM

## 2010-09-21 DIAGNOSIS — E785 Hyperlipidemia, unspecified: Secondary | ICD-10-CM

## 2010-09-21 DIAGNOSIS — M659 Synovitis and tenosynovitis, unspecified: Secondary | ICD-10-CM

## 2010-09-21 DIAGNOSIS — Z0000 Encounter for general adult medical examination without abnormal findings: Secondary | ICD-10-CM

## 2010-09-21 NOTE — Assessment & Plan Note (Signed)
Blood pressure control remains at goal review of basic metabolic panel shows a stable creatinine and BUN

## 2010-09-21 NOTE — Assessment & Plan Note (Signed)
Improved continues to stretch hamstrings which help to alleviate the situation

## 2010-09-21 NOTE — Progress Notes (Signed)
  Subjective:    Patient ID: Cody Woodard, male    DOB: Feb 28, 1966, 45 y.o.   MRN: [id number]  HPI  patient is a 45 year old white male who presents for followup of hypertension hyperlipidemia and a history of back pain with radiation to his knees bilaterally.   laboratory values were obtained prior to this office visit for his hyperlipidemia monitoring and his hypertension  monitoring which included a basic metabolic panel liver panel and a lipid panel.   the patient has lost weight has been exercising regularly his blood pressure is well controlled he denies chest pain shortness of breath PND orthopnea or any cardiovascular symptoms.  He has stopped smoking now for approximately one year and has gotten his weight under control     Review of Systems  Constitutional: Negative for fever and fatigue.  HENT: Negative for hearing loss, congestion, neck pain and postnasal drip.   Eyes: Negative for discharge, redness and visual disturbance.  Respiratory: Negative for cough, shortness of breath and wheezing.   Cardiovascular: Negative for leg swelling.  Gastrointestinal: Negative for abdominal pain, constipation and abdominal distention.  Genitourinary: Negative for urgency and frequency.  Musculoskeletal: Negative for joint swelling and arthralgias.  Skin: Negative for color change and rash.  Neurological: Negative for weakness and light-headedness.  Hematological: Negative for adenopathy.  Psychiatric/Behavioral: Negative for behavioral problems.       Objective:   Physical Exam  Constitutional: He is oriented to person, place, and time. He appears well-developed and well-nourished.  HENT:  Head: Normocephalic and atraumatic.  Eyes: Conjunctivae are normal. Pupils are equal, round, and reactive to light.  Neck: Normal range of motion. Neck supple.  Cardiovascular: Normal rate and regular rhythm.   Pulmonary/Chest: Effort normal and breath sounds normal.  Abdominal: Soft. Bowel  sounds are normal.  Musculoskeletal: He exhibits tenderness.  Neurological: He is alert and oriented to person, place, and time.  Skin: Rash noted. There is erythema.        Mild rosacea at the skin folds of the face    I have reviewed this patient's past medical history surgical history social history current medication list and current allergy list and have documented appropriate changes as necessary        Assessment & Plan:   see problem focused exam

## 2010-09-21 NOTE — Assessment & Plan Note (Signed)
Patient continues an active diet plan with weight loss his cholesterol has improved to the best readings that we have recorded specifically his LDL is the lowest that it has been on medications and his triglycerides are now within goal continue current intervention without change.

## 2010-12-17 ENCOUNTER — Other Ambulatory Visit: Payer: Self-pay | Admitting: Internal Medicine

## 2011-02-16 ENCOUNTER — Ambulatory Visit (INDEPENDENT_AMBULATORY_CARE_PROVIDER_SITE_OTHER): Payer: 59 | Admitting: Sports Medicine

## 2011-02-16 ENCOUNTER — Encounter: Payer: Self-pay | Admitting: Sports Medicine

## 2011-02-16 VITALS — BP 138/84 | HR 67 | Ht 72.0 in | Wt 200.0 lb

## 2011-02-16 DIAGNOSIS — M7022 Olecranon bursitis, left elbow: Secondary | ICD-10-CM

## 2011-02-16 DIAGNOSIS — M659 Synovitis and tenosynovitis, unspecified: Secondary | ICD-10-CM

## 2011-02-16 DIAGNOSIS — M7710 Lateral epicondylitis, unspecified elbow: Secondary | ICD-10-CM

## 2011-02-16 DIAGNOSIS — M7020 Olecranon bursitis, unspecified elbow: Secondary | ICD-10-CM

## 2011-02-16 HISTORY — DX: Olecranon bursitis, left elbow: M70.22

## 2011-02-16 NOTE — Assessment & Plan Note (Signed)
He should continue to monitor this. If he gets a flare or signs of swelling he should be evaluated to be sure he is not having a gout attack associated.  He can return to see me when necessary and will continue his primary care with Dr. Lovell Sheehan

## 2011-02-16 NOTE — Assessment & Plan Note (Signed)
We recommended a maintenance exercise program 3 times a week to keep him from getting recurrences. She did get a recurrence he should rest this for a few days use some gentle icing resume his exercises at a lower weight and benefits not resolving we can reevaluate

## 2011-02-16 NOTE — Patient Instructions (Signed)
Triceps - esp on left - 5 lb weight curls slow extension, fast flexion  Continue standard exercise regimen 3 times per week for prevention Grip squeezer 3 sets of 15- 3 times per week Triceps- overhead extension 3 sets 15- 3 times per week    Do ice massage if elbows get sore Return for follow up as needed

## 2011-02-16 NOTE — Assessment & Plan Note (Signed)
There is a chronic calcific tendinitis of the triceps tendon. I suggested adding a few triceps exercises to his regimen on a regular basis to prevent any recurrence of symptoms from this

## 2011-02-16 NOTE — Progress Notes (Signed)
  Subjective:    Patient ID: Cody Woodard, male    DOB: [birth year], 45 y.o.   MRN: [id number]  HPI  Pt presents to clinic for f/u of rt lateral epicondylitis which he states is 99% improved.   Stopped NTG last dec because he was getting skin irritation.  Did HEP daily until 1 month ago, now does exercises occasionally.  Has built up to 5 lbs on the wrist flexion/extension and rolls.  Does not have pain in elbow, is only aware of the area when he does 40 lb curls.  Wanted to f/u to ensure he did not re injure the elbows.      Review of Systems     Objective:   Physical Exam NAD    Lt elbow exam: Chronic olecranon bursal thickening Full extension and flexion Slight tight on supination, loose on pronation Neg book test  Rt elbow exam: Full extension and flexion Excess pronation, slightly limited on supination  Surgical scar rt wrist Neg book test  Musculoskeletal ultrasound Right elbow shows a small avulsion fragment at the tip of the lateral epicondyle There is some slight hypoechoic change at the proximal extensor tendon insertion No abnormal Doppler activity No calcifications No tears Normal triceps tendon  Left elbow shows a very small avulsion Fragment at the tip of the epicondyle There is no hypoechoic change or signs of tearing At the triceps tendon there is significant calcification at the insertion There is a partially calcified olecranon bursa that is thickened but has no abnormal fluid accumulation at this time    Assessment & Plan:

## 2011-03-02 ENCOUNTER — Other Ambulatory Visit: Payer: Self-pay | Admitting: Internal Medicine

## 2011-03-04 ENCOUNTER — Other Ambulatory Visit (INDEPENDENT_AMBULATORY_CARE_PROVIDER_SITE_OTHER): Payer: 59

## 2011-03-04 DIAGNOSIS — Z0000 Encounter for general adult medical examination without abnormal findings: Secondary | ICD-10-CM

## 2011-03-04 LAB — CBC WITH DIFFERENTIAL/PLATELET
Basophils Relative: 0.4 % (ref 0.0–3.0)
Eosinophils Absolute: 0 10*3/uL (ref 0.0–0.7)
Eosinophils Relative: 0.7 % (ref 0.0–5.0)
Hemoglobin: 14.3 g/dL (ref 13.0–17.0)
MCHC: 34.5 g/dL (ref 30.0–36.0)
MCV: 96.3 fl (ref 78.0–100.0)
Monocytes Absolute: 0.4 10*3/uL (ref 0.1–1.0)
Neutro Abs: 2.5 10*3/uL (ref 1.4–7.7)
RBC: 4.3 Mil/uL (ref 4.22–5.81)

## 2011-03-04 LAB — BASIC METABOLIC PANEL
CO2: 28 mEq/L (ref 19–32)
Calcium: 9.6 mg/dL (ref 8.4–10.5)
Creatinine, Ser: 1 mg/dL (ref 0.4–1.5)
GFR: 88.35 mL/min (ref >60.00)
Sodium: 141 mEq/L (ref 135–145)

## 2011-03-04 LAB — LIPID PANEL
HDL: 43.7 mg/dL (ref >39.00)
Triglycerides: 191 mg/dL — ABNORMAL HIGH (ref 0.0–149.0)
VLDL: 38.2 mg/dL (ref 0.0–40.0)

## 2011-03-04 LAB — POCT URINALYSIS DIPSTICK
Bilirubin, UA: NEGATIVE
Blood, UA: NEGATIVE
Glucose, UA: NEGATIVE
Ketones, UA: NEGATIVE
Spec Grav, UA: 1.025

## 2011-03-04 LAB — HEPATIC FUNCTION PANEL
Albumin: 4.7 g/dL (ref 3.5–5.2)
Alkaline Phosphatase: 46 U/L (ref 39–117)
Bilirubin, Direct: 0.1 mg/dL (ref 0.0–0.3)
Total Protein: 7.4 g/dL (ref 6.0–8.3)

## 2011-03-04 LAB — LDL CHOLESTEROL, DIRECT: Direct LDL: 115.1 mg/dL

## 2011-03-08 ENCOUNTER — Telehealth: Payer: Self-pay | Admitting: *Deleted

## 2011-03-08 NOTE — Telephone Encounter (Signed)
Opened in error

## 2011-03-11 ENCOUNTER — Encounter: Payer: Self-pay | Admitting: Internal Medicine

## 2011-03-11 ENCOUNTER — Ambulatory Visit (INDEPENDENT_AMBULATORY_CARE_PROVIDER_SITE_OTHER): Payer: 59 | Admitting: Internal Medicine

## 2011-03-11 VITALS — BP 140/80 | HR 76 | Temp 98.2°F | Resp 16 | Ht 72.0 in | Wt 206.0 lb

## 2011-03-11 DIAGNOSIS — E785 Hyperlipidemia, unspecified: Secondary | ICD-10-CM

## 2011-03-11 DIAGNOSIS — Z23 Encounter for immunization: Secondary | ICD-10-CM

## 2011-03-11 DIAGNOSIS — I10 Essential (primary) hypertension: Secondary | ICD-10-CM

## 2011-03-11 DIAGNOSIS — Z0000 Encounter for general adult medical examination without abnormal findings: Secondary | ICD-10-CM

## 2011-03-11 MED ORDER — BISOPROLOL-HYDROCHLOROTHIAZIDE 5-6.25 MG PO TABS: 1.0000 | ORAL_TABLET | Freq: Every day | ORAL | Status: DC

## 2011-03-11 NOTE — Progress Notes (Signed)
  Subjective:    Patient ID: Cody Woodard, male    DOB: 03-19-1966, 45 y.o.   MRN: [id number]  HPI  Patient presents for complete physical examination and review of chronic problems of hyperlipidemia he also has hypertension that is now better controlled he denies any chest pain shortness of breath PND orthopnea has psoriasis of the scalp was controlled with topical steroids  Review of Systems  Constitutional: Negative for fever and fatigue.  HENT: Negative for hearing loss, congestion, neck pain and postnasal drip.   Eyes: Negative for discharge, redness and visual disturbance.  Respiratory: Negative for cough, shortness of breath and wheezing.   Cardiovascular: Negative for leg swelling.  Gastrointestinal: Negative for abdominal pain, constipation and abdominal distention.  Genitourinary: Negative for urgency and frequency.  Musculoskeletal: Negative for joint swelling and arthralgias.  Skin: Negative for color change and rash.  Neurological: Negative for weakness and light-headedness.  Hematological: Negative for adenopathy.  Psychiatric/Behavioral: Negative for behavioral problems.       Past Medical History  Diagnosis Date  . ALLERGIC RHINITIS 11/08/2007  . CHRONIC GOUTY ARTHROPATHY W/O MENTION TOPHUS 02/26/2010  . Gouty arthropathy 01/15/2008  . HYPERLIPIDEMIA 11/08/2007  . HYPERTENSION, ESSENTIAL, UNCONTROLLED 07/20/2010  . PSORIASIS, SCALP 02/14/2008  . SCIATICA, CHRONIC 01/19/2009  . TENDINITIS, LEFT ELBOW 03/15/2010   Past Surgical History  Procedure Date  . Tendon repari   . Laceration repair   . Ear pinning     reports that he quit smoking about 3 years ago. He has never used smokeless tobacco. He reports that he drinks alcohol. He reports that he does not use illicit drugs. family history is not on file.  He is adopted. No Known Allergies  Objective:   Physical Exam  Nursing note and vitals reviewed. Constitutional: He is oriented to person, place, and time. He  appears well-developed and well-nourished.  HENT:  Head: Normocephalic and atraumatic.  Eyes: Conjunctivae are normal. Pupils are equal, round, and reactive to light.  Neck: Normal range of motion. Neck supple.  Cardiovascular: Normal rate and regular rhythm.   Pulmonary/Chest: Effort normal and breath sounds normal.  Abdominal: Soft. Bowel sounds are normal.  Genitourinary: Rectum normal.  Musculoskeletal: Normal range of motion.  Neurological: He is oriented to person, place, and time. He has normal reflexes.  Skin: Skin is warm and dry.  Psychiatric: He has a normal mood and affect. His behavior is normal.          Assessment & Plan:   Patient presents for yearly preventative medicine examination.   all immunizations and health maintenance protocols were reviewed with the patient and they are up to date with these protocols.   screening laboratory values were reviewed with the patient including screening of hyperlipidemia PSA renal function and hepatic function.   There medications past medical history social history problem list and allergies were reviewed in detail.   Goals were established with regard to weight loss exercise diet in compliance with medications The patient's lipids are not at goal [place] his triglycerides are elevated we discussed the rule of diet and exercise in controlling the triglycerides.  We also reviewed his weight and talk about fluctuation in weight impact on the triglycerides.  The elevation of triglycerides a well as his genetic positive for diabetes increase his risk for both  cardiovascular disease as well as diabetes

## 2011-04-11 ENCOUNTER — Encounter: Payer: Self-pay | Admitting: Internal Medicine

## 2011-07-11 ENCOUNTER — Encounter: Payer: Self-pay | Admitting: Internal Medicine

## 2011-07-11 ENCOUNTER — Ambulatory Visit (INDEPENDENT_AMBULATORY_CARE_PROVIDER_SITE_OTHER): Payer: 59 | Admitting: Internal Medicine

## 2011-07-11 VITALS — BP 130/80 | HR 72 | Temp 98.6°F | Resp 16 | Ht 72.0 in | Wt 215.0 lb

## 2011-07-11 DIAGNOSIS — T887XXA Unspecified adverse effect of drug or medicament, initial encounter: Secondary | ICD-10-CM

## 2011-07-11 DIAGNOSIS — L219 Seborrheic dermatitis, unspecified: Secondary | ICD-10-CM

## 2011-07-11 DIAGNOSIS — M549 Dorsalgia, unspecified: Secondary | ICD-10-CM

## 2011-07-11 DIAGNOSIS — E785 Hyperlipidemia, unspecified: Secondary | ICD-10-CM

## 2011-07-11 DIAGNOSIS — L218 Other seborrheic dermatitis: Secondary | ICD-10-CM

## 2011-07-11 DIAGNOSIS — I10 Essential (primary) hypertension: Secondary | ICD-10-CM

## 2011-07-11 LAB — HEPATIC FUNCTION PANEL
AST: 33 U/L (ref 0–37)
Albumin: 4.6 g/dL (ref 3.5–5.2)
Total Protein: 7.5 g/dL (ref 6.0–8.3)

## 2011-07-11 LAB — LIPID PANEL
HDL: 50.7 mg/dL (ref >39.00)
Total CHOL/HDL Ratio: 4
Triglycerides: 140 mg/dL (ref 0.0–149.0)
VLDL: 28 mg/dL (ref 0.0–40.0)

## 2011-07-11 LAB — LDL CHOLESTEROL, DIRECT: Direct LDL: 115.5 mg/dL

## 2011-07-11 MED ORDER — FLUOCINOLONE ACETONIDE 0.01 % EX OIL: TOPICAL_OIL | Freq: Three times a day (TID) | CUTANEOUS | Status: DC

## 2011-07-11 NOTE — Patient Instructions (Signed)
The patient is instructed to continue all medications as prescribed. Schedule followup with check out clerk upon leaving the clinic  

## 2011-07-11 NOTE — Progress Notes (Signed)
Subjective:    Patient ID: Cody Woodard, male    DOB: [birth year], 45 y.o.   MRN: [id number]  HPI Present for followup of hypertension hyperlipidemia and psoriasis versus seborrhea of the scalp.  He's been to dermatologist who diagnosed it as seborrhea gave him an antifungal as well as a topical steroid foam.  The topical steroid form has been too hard to apply it he has not been using it the fungal shampoo has helped.     Review of Systems  Constitutional: Negative for fever and fatigue.  HENT: Negative for hearing loss, congestion, neck pain and postnasal drip.   Eyes: Negative for discharge, redness and visual disturbance.  Respiratory: Negative for cough, shortness of breath and wheezing.   Cardiovascular: Negative for leg swelling.  Gastrointestinal: Negative for abdominal pain, constipation and abdominal distention.  Genitourinary: Negative for urgency and frequency.  Musculoskeletal: Negative for joint swelling and arthralgias.  Skin: Negative for color change and rash.  Neurological: Negative for weakness and light-headedness.  Hematological: Negative for adenopathy.  Psychiatric/Behavioral: Negative for behavioral problems.       Past Medical History  Diagnosis Date  . ALLERGIC RHINITIS 11/08/2007  . CHRONIC GOUTY ARTHROPATHY W/O MENTION TOPHUS 02/26/2010  . Gouty arthropathy 01/15/2008  . HYPERLIPIDEMIA 11/08/2007  . HYPERTENSION, ESSENTIAL, UNCONTROLLED 07/20/2010  . PSORIASIS, SCALP 02/14/2008  . SCIATICA, CHRONIC 01/19/2009  . TENDINITIS, LEFT ELBOW 03/15/2010    History   Social History  . Marital Status: Married    Spouse Name: N/A    Number of Children: N/A  . Years of Education: N/A   Occupational History  . Not on file.   Social History Main Topics  . Smoking status: Former Smoker    Quit date: 08/09/2007  . Smokeless tobacco: Never Used  . Alcohol Use: Yes  . Drug Use: No  . Sexually Active: Not on file   Other Topics Concern  . Not on file    Social History Narrative  . No narrative on file    Past Surgical History  Procedure Date  . Tendon repari   . Laceration repair   . Ear pinning     Family History  Problem Relation Age of Onset  . Adopted: Yes    No Known Allergies  Current Outpatient Prescriptions on File Prior to Visit  Medication Sig Dispense Refill  . bisoprolol-hydrochlorothiazide (ZIAC) 5-6.25 MG per tablet Take 1 tablet by mouth daily.  30 tablet  11  . mometasone (ELOCON) 0.1 % cream Apply topically daily. Use as needed        . naproxen sodium (ANAPROX) 220 MG tablet Take 220 mg by mouth as needed.        . simvastatin (ZOCOR) 40 MG tablet TAKE ONE TABLET BY MOUTH ONE TIME DAILY  30 tablet  10    BP 130/80  Pulse 72  Temp 98.6 F (37 C)  Resp 16  Ht 6' (1.829 m)  Wt 215 lb (97.523 kg)  BMI 29.16 kg/m2    Objective:   Physical Exam  Nursing note and vitals reviewed. Constitutional: He appears well-developed and well-nourished.  HENT:  Head: Normocephalic and atraumatic.  Eyes: Conjunctivae are normal. Pupils are equal, round, and reactive to light.  Neck: Normal range of motion. Neck supple.  Cardiovascular: Normal rate and regular rhythm.   Pulmonary/Chest: Effort normal and breath sounds normal.  Abdominal: Soft. Bowel sounds are normal.          Assessment & Plan:  Patient has  controlled hypertension his current medications.  He has hyperlipidemia and a lipid panel and liver will be drawn today.  He has scalp dermatitis that is felt to be a seborrheic type dermatitis and we have given him a prescription for FS oil

## 2011-11-14 ENCOUNTER — Ambulatory Visit (INDEPENDENT_AMBULATORY_CARE_PROVIDER_SITE_OTHER): Payer: 59 | Admitting: Family

## 2011-11-14 ENCOUNTER — Encounter: Payer: Self-pay | Admitting: Family

## 2011-11-14 DIAGNOSIS — M109 Gout, unspecified: Secondary | ICD-10-CM

## 2011-11-14 DIAGNOSIS — M79676 Pain in unspecified toe(s): Secondary | ICD-10-CM

## 2011-11-14 DIAGNOSIS — M79609 Pain in unspecified limb: Secondary | ICD-10-CM

## 2011-11-14 MED ORDER — INDOMETHACIN 25 MG PO CAPS: 25.0000 mg | ORAL_CAPSULE | Freq: Two times a day (BID) | ORAL | Status: AC

## 2011-11-14 MED ORDER — METHYLPREDNISOLONE ACETATE 80 MG/ML IJ SUSP: 80.0000 mg | Freq: Once | INTRAMUSCULAR | Status: DC

## 2011-11-14 NOTE — Patient Instructions (Signed)
Gout Gout is an inflammatory condition (arthritis) caused by a buildup of uric acid crystals in the joints. Uric acid is a chemical that is normally present in the blood. Under some circumstances, uric acid can form into crystals in your joints. This causes joint redness, soreness, and swelling (inflammation). Repeat attacks are common. Over time, uric acid crystals can form into masses (tophi) near a joint, causing disfigurement. Gout is treatable and often preventable. CAUSES  The disease begins with elevated levels of uric acid in the blood. Uric acid is produced by your body when it breaks down a naturally found substance called purines. This also happens when you eat certain foods such as meats and fish. Causes of an elevated uric acid level include:  Being passed down from parent to child (heredity).   Diseases that cause increased uric acid production (obesity, psoriasis, some cancers).   Excessive alcohol use.   Diet, especially diets rich in meat and seafood.   Medicines, including certain cancer-fighting drugs (chemotherapy), diuretics, and aspirin.   Chronic kidney disease. The kidneys are no longer able to remove uric acid well.   Problems with metabolism.  Conditions strongly associated with gout include:  Obesity.   High blood pressure.   High cholesterol.   Diabetes.  Not everyone with elevated uric acid levels gets gout. It is not understood why some people get gout and others do not. Surgery, joint injury, and eating too much of certain foods are some of the factors that can lead to gout. SYMPTOMS   An attack of gout comes on quickly. It causes intense pain with redness, swelling, and warmth in a joint.   Fever can occur.   Often, only one joint is involved. Certain joints are more commonly involved:   Base of the big toe.   Knee.   Ankle.   Wrist.   Finger.  Without treatment, an attack usually goes away in a few days to weeks. Between attacks, you  usually will not have symptoms, which is different from many other forms of arthritis. DIAGNOSIS  Your caregiver will suspect gout based on your symptoms and exam. Removal of fluid from the joint (arthrocentesis) is done to check for uric acid crystals. Your caregiver will give you a medicine that numbs the area (local anesthetic) and use a needle to remove joint fluid for exam. Gout is confirmed when uric acid crystals are seen in joint fluid, using a special microscope. Sometimes, blood, urine, and X-ray tests are also used. TREATMENT  There are 2 phases to gout treatment: treating the sudden onset (acute) attack and preventing attacks (prophylaxis). Treatment of an Acute Attack  Medicines are used. These include anti-inflammatory medicines or steroid medicines.   An injection of steroid medicine into the affected joint is sometimes necessary.   The painful joint is rested. Movement can worsen the arthritis.   You may use warm or cold treatments on painful joints, depending which works best for you.   Discuss the use of coffee, vitamin C, or cherries with your caregiver. These may be helpful treatment options.  Treatment to Prevent Attacks After the acute attack subsides, your caregiver may advise prophylactic medicine. These medicines either help your kidneys eliminate uric acid from your body or decrease your uric acid production. You may need to stay on these medicines for a very long time. The early phase of treatment with prophylactic medicine can be associated with an increase in acute gout attacks. For this reason, during the first few months   of treatment, your caregiver may also advise you to take medicines usually used for acute gout treatment. Be sure you understand your caregiver's directions. You should also discuss dietary treatment with your caregiver. Certain foods such as meats and fish can increase uric acid levels. Other foods such as dairy can decrease levels. Your caregiver  can give you a list of foods to avoid. HOME CARE INSTRUCTIONS   Do not take aspirin to relieve pain. This raises uric acid levels.   Only take over-the-counter or prescription medicines for pain, discomfort, or fever as directed by your caregiver.   Rest the joint as much as possible. When in bed, keep sheets and blankets off painful areas.   Keep the affected joint raised (elevated).   Use crutches if the painful joint is in your leg.   Drink enough water and fluids to keep your urine clear or pale yellow. This helps your body get rid of uric acid. Do not drink alcoholic beverages. They slow the passage of uric acid.   Follow your caregiver's dietary instructions. Pay careful attention to the amount of protein you eat. Your daily diet should emphasize fruits, vegetables, whole grains, and fat-free or low-fat milk products.   Maintain a healthy body weight.  SEEK MEDICAL CARE IF:   You have an oral temperature above 102 F (38.9 C).   You develop diarrhea, vomiting, or any side effects from medicines.   You do not feel better in 24 hours, or you are getting worse.  SEEK IMMEDIATE MEDICAL CARE IF:   Your joint becomes suddenly more tender and you have:   Chills.   An oral temperature above 102 F (38.9 C), not controlled by medicine.  MAKE SURE YOU:   Understand these instructions.   Will watch your condition.   Will get help right away if you are not doing well or get worse.  Document Released: 07/22/2000 Document Revised: 07/14/2011 Document Reviewed: 11/02/2009 [employer] Patient Information 2012 [employer]. 

## 2011-11-14 NOTE — Progress Notes (Signed)
Subjective:    Patient ID: Cody Woodard, male    DOB: 1965-08-27, 46 y.o.   MRN: [id number]  HPI Comments: 46 yo white male presents with c/o gout flare up left foot started Fri, pain 9/10 described as sharp, aching. Pain goes down to 5/10 with naproxen and ice. Pain gets worse with shoe and ambulation.      Review of Systems  Constitutional: Negative.   Respiratory: Negative.   Cardiovascular: Negative.   Musculoskeletal: Positive for joint swelling.       Left great toe swelling.   Skin:       Left great toe red, and tender  Hematological: Negative.   Psychiatric/Behavioral: Negative.    Past Medical History  Diagnosis Date  . ALLERGIC RHINITIS 11/08/2007  . CHRONIC GOUTY ARTHROPATHY W/O MENTION TOPHUS 02/26/2010  . Gouty arthropathy 01/15/2008  . HYPERLIPIDEMIA 11/08/2007  . HYPERTENSION, ESSENTIAL, UNCONTROLLED 07/20/2010  . PSORIASIS, SCALP 02/14/2008  . SCIATICA, CHRONIC 01/19/2009  . TENDINITIS, LEFT ELBOW 03/15/2010    History   Social History  . Marital Status: Married    Spouse Name: N/A    Number of Children: N/A  . Years of Education: N/A   Occupational History  . Not on file.   Social History Main Topics  . Smoking status: Former Smoker    Quit date: 08/09/2007  . Smokeless tobacco: Never Used  . Alcohol Use: Yes  . Drug Use: No  . Sexually Active: Not on file   Other Topics Concern  . Not on file   Social History Narrative  . No narrative on file    Past Surgical History  Procedure Date  . Tendon repari   . Laceration repair   . Ear pinning     Family History  Problem Relation Age of Onset  . Adopted: Yes    No Known Allergies  Current Outpatient Prescriptions on File Prior to Visit  Medication Sig Dispense Refill  . bisoprolol-hydrochlorothiazide (ZIAC) 5-6.25 MG per tablet Take 1 tablet by mouth daily.  30 tablet  11  . fluocinolone (DERMA-SMOOTHE/FS SCALP) 0.01 % external oil Apply topically 3 (three) times daily.  120 mL  0  .  mometasone (ELOCON) 0.1 % cream Apply topically daily. Use as needed        . naproxen sodium (ANAPROX) 220 MG tablet Take 220 mg by mouth as needed.        . simvastatin (ZOCOR) 40 MG tablet TAKE ONE TABLET BY MOUTH ONE TIME DAILY  30 tablet  10   No current facility-administered medications on file prior to visit.    BP 142/88  Temp(Src) 98.3 F (36.8 C) (Oral)  Wt 213 lb (96.616 kg)chart     Objective:   Physical Exam  Constitutional: He is oriented to person, place, and time. He appears well-developed and well-nourished.  Neck: Normal range of motion. Neck supple.  Cardiovascular: Normal rate, regular rhythm, normal heart sounds and intact distal pulses.  Exam reveals no gallop and no friction rub.   No murmur heard. Pulmonary/Chest: Effort normal and breath sounds normal. No respiratory distress. He has no wheezes. He has no rales. He exhibits no tenderness.  Musculoskeletal: He exhibits tenderness.       Left great toe. Painful with ROM  Neurological: He is alert and oriented to person, place, and time.  Skin: Skin is warm and dry. He is not diaphoretic. There is erythema.          Left great toe  Psychiatric:  He has a normal mood and affect.          Assessment & Plan:  Assessment: Gout Plan: Indocin, depo-medrol. Take indocin with food and report any black tarry stools immediately to clinic. Teaching handouts on diagnosis and medications provided. Encouraged to RTC if pain gets worse

## 2012-01-30 ENCOUNTER — Ambulatory Visit (INDEPENDENT_AMBULATORY_CARE_PROVIDER_SITE_OTHER): Payer: 59 | Admitting: Family

## 2012-01-30 ENCOUNTER — Telehealth: Payer: Self-pay | Admitting: Internal Medicine

## 2012-01-30 ENCOUNTER — Encounter: Payer: Self-pay | Admitting: Family

## 2012-01-30 VITALS — BP 136/90 | Wt 207.0 lb

## 2012-01-30 DIAGNOSIS — IMO0002 Reserved for concepts with insufficient information to code with codable children: Secondary | ICD-10-CM

## 2012-01-30 DIAGNOSIS — M25579 Pain in unspecified ankle and joints of unspecified foot: Secondary | ICD-10-CM

## 2012-01-30 DIAGNOSIS — M25473 Effusion, unspecified ankle: Secondary | ICD-10-CM

## 2012-01-30 MED ORDER — PREDNISONE 20 MG PO TABS: ORAL_TABLET | ORAL | Status: AC

## 2012-01-30 MED ORDER — HYDROCODONE-ACETAMINOPHEN 7.5-750 MG PO TABS: 1.0000 | ORAL_TABLET | Freq: Three times a day (TID) | ORAL | Status: AC | PRN

## 2012-01-30 MED ORDER — KETOROLAC TROMETHAMINE 60 MG/2ML IM SOLN
60.0000 mg | Freq: Once | INTRAMUSCULAR | Status: AC
  Administered 2012-01-30: 60 mg via INTRAMUSCULAR

## 2012-01-30 NOTE — Addendum Note (Signed)
Addended byAdline Mango B on: 01/30/2012 03:56 PM   Modules accepted: Orders

## 2012-01-30 NOTE — Telephone Encounter (Signed)
Added uric acid to cpx labs

## 2012-01-30 NOTE — Patient Instructions (Signed)
Gout Gout is an inflammatory condition (arthritis) caused by a buildup of uric acid crystals in the joints. Uric acid is a chemical that is normally present in the blood. Under some circumstances, uric acid can form into crystals in your joints. This causes joint redness, soreness, and swelling (inflammation). Repeat attacks are common. Over time, uric acid crystals can form into masses (tophi) near a joint, causing disfigurement. Gout is treatable and often preventable. CAUSES  The disease begins with elevated levels of uric acid in the blood. Uric acid is produced by your body when it breaks down a naturally found substance called purines. This also happens when you eat certain foods such as meats and fish. Causes of an elevated uric acid level include:  Being passed down from parent to child (heredity).   Diseases that cause increased uric acid production (obesity, psoriasis, some cancers).   Excessive alcohol use.   Diet, especially diets rich in meat and seafood.   Medicines, including certain cancer-fighting drugs (chemotherapy), diuretics, and aspirin.   Chronic kidney disease. The kidneys are no longer able to remove uric acid well.   Problems with metabolism.  Conditions strongly associated with gout include:  Obesity.   High blood pressure.   High cholesterol.   Diabetes.  Not everyone with elevated uric acid levels gets gout. It is not understood why some people get gout and others do not. Surgery, joint injury, and eating too much of certain foods are some of the factors that can lead to gout. SYMPTOMS   An attack of gout comes on quickly. It causes intense pain with redness, swelling, and warmth in a joint.   Fever can occur.   Often, only one joint is involved. Certain joints are more commonly involved:   Base of the big toe.   Knee.   Ankle.   Wrist.   Finger.  Without treatment, an attack usually goes away in a few days to weeks. Between attacks, you  usually will not have symptoms, which is different from many other forms of arthritis. DIAGNOSIS  Your caregiver will suspect gout based on your symptoms and exam. Removal of fluid from the joint (arthrocentesis) is done to check for uric acid crystals. Your caregiver will give you a medicine that numbs the area (local anesthetic) and use a needle to remove joint fluid for exam. Gout is confirmed when uric acid crystals are seen in joint fluid, using a special microscope. Sometimes, blood, urine, and X-ray tests are also used. TREATMENT  There are 2 phases to gout treatment: treating the sudden onset (acute) attack and preventing attacks (prophylaxis). Treatment of an Acute Attack  Medicines are used. These include anti-inflammatory medicines or steroid medicines.   An injection of steroid medicine into the affected joint is sometimes necessary.   The painful joint is rested. Movement can worsen the arthritis.   You may use warm or cold treatments on painful joints, depending which works best for you.   Discuss the use of coffee, vitamin C, or cherries with your caregiver. These may be helpful treatment options.  Treatment to Prevent Attacks After the acute attack subsides, your caregiver may advise prophylactic medicine. These medicines either help your kidneys eliminate uric acid from your body or decrease your uric acid production. You may need to stay on these medicines for a very long time. The early phase of treatment with prophylactic medicine can be associated with an increase in acute gout attacks. For this reason, during the first few months   of treatment, your caregiver may also advise you to take medicines usually used for acute gout treatment. Be sure you understand your caregiver's directions. You should also discuss dietary treatment with your caregiver. Certain foods such as meats and fish can increase uric acid levels. Other foods such as dairy can decrease levels. Your caregiver  can give you a list of foods to avoid. HOME CARE INSTRUCTIONS   Do not take aspirin to relieve pain. This raises uric acid levels.   Only take over-the-counter or prescription medicines for pain, discomfort, or fever as directed by your caregiver.   Rest the joint as much as possible. When in bed, keep sheets and blankets off painful areas.   Keep the affected joint raised (elevated).   Use crutches if the painful joint is in your leg.   Drink enough water and fluids to keep your urine clear or pale yellow. This helps your body get rid of uric acid. Do not drink alcoholic beverages. They slow the passage of uric acid.   Follow your caregiver's dietary instructions. Pay careful attention to the amount of protein you eat. Your daily diet should emphasize fruits, vegetables, whole grains, and fat-free or low-fat milk products.   Maintain a healthy body weight.  SEEK MEDICAL CARE IF:   You have an oral temperature above 102 F (38.9 C).   You develop diarrhea, vomiting, or any side effects from medicines.   You do not feel better in 24 hours, or you are getting worse.  SEEK IMMEDIATE MEDICAL CARE IF:   Your joint becomes suddenly more tender and you have:   Chills.   An oral temperature above 102 F (38.9 C), not controlled by medicine.  MAKE SURE YOU:   Understand these instructions.   Will watch your condition.   Will get help right away if you are not doing well or get worse.  Document Released: 07/22/2000 Document Revised: 07/14/2011 Document Reviewed: 11/02/2009 [employer] Patient Information 2012 [employer]. 

## 2012-01-30 NOTE — Telephone Encounter (Signed)
Pt called and wants to make sure that Uric Acid test is added to his cpx labs in August. Pt is coming in today to see Adline Mango re: gout flare up.

## 2012-01-30 NOTE — Progress Notes (Signed)
Subjective:    Patient ID: Cody Woodard, male    DOB: Jan 04, 1966, 45 y.o.   MRN: [id number]  HPI 46 year old white male, nonsmoker is in with complaints of right ankle pain x2 days. He had a flare similar in April 2013. Was treated with anti-inflammatory medications and eventually got better. Reports taken about one month for him to be comfortably put on a sneaker. The sclerae typically happen after exercising extensively. His intake and medicine with no relief. Had left over Vicodin from a tooth extraction that has helped his symptoms. He's changed his diet to decrease it intake a purine rich foods.   Review of Systems  Constitutional: Negative.   Respiratory: Negative.   Cardiovascular: Negative.   Musculoskeletal: Positive for joint swelling and arthralgias.       Right ankle swollen, tender and minimally red.   Skin: Negative.   Neurological: Negative.   Hematological: Negative.   Psychiatric/Behavioral: Negative.    Past Medical History  Diagnosis Date  . ALLERGIC RHINITIS 11/08/2007  . CHRONIC GOUTY ARTHROPATHY W/O MENTION TOPHUS 02/26/2010  . Gouty arthropathy 01/15/2008  . HYPERLIPIDEMIA 11/08/2007  . HYPERTENSION, ESSENTIAL, UNCONTROLLED 07/20/2010  . PSORIASIS, SCALP 02/14/2008  . SCIATICA, CHRONIC 01/19/2009  . TENDINITIS, LEFT ELBOW 03/15/2010    History   Social History  . Marital Status: Married    Spouse Name: N/A    Number of Children: N/A  . Years of Education: N/A   Occupational History  . Not on file.   Social History Main Topics  . Smoking status: Former Smoker    Quit date: 08/09/2007  . Smokeless tobacco: Never Used  . Alcohol Use: Yes  . Drug Use: No  . Sexually Active: Not on file   Other Topics Concern  . Not on file   Social History Narrative  . No narrative on file    Past Surgical History  Procedure Date  . Tendon repari   . Laceration repair   . Ear pinning     Family History  Problem Relation Age of Onset  . Adopted: Yes    No  Known Allergies  Current Outpatient Prescriptions on File Prior to Visit  Medication Sig Dispense Refill  . bisoprolol-hydrochlorothiazide (ZIAC) 5-6.25 MG per tablet Take 1 tablet by mouth daily.  30 tablet  11  . fluocinolone (DERMA-SMOOTHE/FS SCALP) 0.01 % external oil Apply topically 3 (three) times daily.  120 mL  0  . indomethacin (INDOCIN) 25 MG capsule Take 1 capsule (25 mg total) by mouth 2 (two) times daily with a meal. 2 caps po TID x 48 hours then 1 tab po TID until pain resolves.  40 capsule  1  . mometasone (ELOCON) 0.1 % cream Apply topically daily. Use as needed        . naproxen sodium (ANAPROX) 220 MG tablet Take 220 mg by mouth as needed.        . simvastatin (ZOCOR) 40 MG tablet TAKE ONE TABLET BY MOUTH ONE TIME DAILY  30 tablet  10   Current Facility-Administered Medications on File Prior to Visit  Medication Dose Route Frequency Provider Last Rate Last Dose  . methylPREDNISolone acetate (DEPO-MEDROL) injection 80 mg  80 mg Intramuscular Once Baker Pierini, FNP        BP 136/90  Wt 207 lb (93.895 kg)chart    Objective:   Physical Exam  Constitutional: He is oriented to person, place, and time. He appears well-developed and well-nourished.  HENT:  Right Ear: External ear  normal.  Left Ear: External ear normal.  Nose: Nose normal.  Mouth/Throat: Oropharynx is clear and moist.  Cardiovascular: Normal rate, regular rhythm and normal heart sounds.   Pulmonary/Chest: Effort normal and breath sounds normal.  Abdominal: Soft. Bowel sounds are normal.  Musculoskeletal:       Right ankle minimally red, moderately tender to touch. Pain with range of motion. Pulses 2 out of 2. Warm to touch. Moderate swelling to the ankle.  Neurological: He is alert and oriented to person, place, and time.  Skin: Skin is warm and dry.  Psychiatric: He has a normal mood and affect.     Toradol 60 mg IM x1.     Assessment & Plan:  Assessment: Gout versus osteoarthritis of the  right ankle  Plan: At last office visit patient did not have uric as a level drawn. Therefore, we will draw uric acid level today. If normal, would consider an x-ray of the right ankle. Prednisone 60x3, 40x3, 20x3. Vicodin when necessary. Patient call the office if symptoms worsen or persist. Recheck a schedule, when necessary.

## 2012-01-31 ENCOUNTER — Telehealth: Payer: Self-pay | Admitting: Family

## 2012-01-31 ENCOUNTER — Ambulatory Visit (INDEPENDENT_AMBULATORY_CARE_PROVIDER_SITE_OTHER)
Admission: RE | Admit: 2012-01-31 | Discharge: 2012-01-31 | Disposition: A | Discharge status: Home / Self Care | Payer: 59 | Source: Ambulatory Visit | Attending: Family | Admitting: Family

## 2012-01-31 DIAGNOSIS — IMO0002 Reserved for concepts with insufficient information to code with codable children: Secondary | ICD-10-CM

## 2012-01-31 DIAGNOSIS — M25579 Pain in unspecified ankle and joints of unspecified foot: Secondary | ICD-10-CM

## 2012-01-31 DIAGNOSIS — M25476 Effusion, unspecified foot: Secondary | ICD-10-CM

## 2012-01-31 DIAGNOSIS — M25473 Effusion, unspecified ankle: Secondary | ICD-10-CM

## 2012-01-31 NOTE — Telephone Encounter (Signed)
Patient reports significant improvement in pain of his right ankle. Advised to consider Allopurinol or Uloric for management of Gout. He would like to see Dr. Lovell Sheehan to further discuss. Appointment made by Bland Span.

## 2012-02-09 ENCOUNTER — Other Ambulatory Visit: Payer: Self-pay | Admitting: Internal Medicine

## 2012-02-15 ENCOUNTER — Ambulatory Visit (INDEPENDENT_AMBULATORY_CARE_PROVIDER_SITE_OTHER): Payer: 59 | Admitting: Internal Medicine

## 2012-02-15 ENCOUNTER — Encounter: Payer: Self-pay | Admitting: Internal Medicine

## 2012-02-15 VITALS — BP 120/82 | HR 68 | Temp 98.2°F | Resp 16 | Ht 72.0 in | Wt 208.0 lb

## 2012-02-15 DIAGNOSIS — M109 Gout, unspecified: Secondary | ICD-10-CM

## 2012-02-15 DIAGNOSIS — M1189 Other specified crystal arthropathies, multiple sites: Secondary | ICD-10-CM

## 2012-02-15 DIAGNOSIS — M130 Polyarthritis, unspecified: Secondary | ICD-10-CM

## 2012-02-15 DIAGNOSIS — M1120 Other chondrocalcinosis, unspecified site: Secondary | ICD-10-CM

## 2012-02-15 LAB — URIC ACID: Uric Acid, Serum: 7.6 mg/dL (ref 4.0–7.8)

## 2012-02-15 MED ORDER — COLCHICINE 0.6 MG PO TABS: 0.6000 mg | ORAL_TABLET | Freq: Every day | ORAL | Status: DC

## 2012-02-15 MED ORDER — METHYLPREDNISOLONE (PAK) 4 MG PO TABS: 4.0000 mg | ORAL_TABLET | Freq: Every day | ORAL | Status: DC

## 2012-02-15 NOTE — Patient Instructions (Addendum)
The patient is instructed to continue all medications as prescribed. Schedule followup with check out clerk upon leaving the clinic Gout Gout is an inflammatory condition (arthritis) caused by a buildup of uric acid crystals in the joints. Uric acid is a chemical that is normally present in the blood. Under some circumstances, uric acid can form into crystals in your joints. This causes joint redness, soreness, and swelling (inflammation). Repeat attacks are common. Over time, uric acid crystals can form into masses (tophi) near a joint, causing disfigurement. Gout is treatable and often preventable. CAUSES  The disease begins with elevated levels of uric acid in the blood. Uric acid is produced by your body when it breaks down a naturally found substance called purines. This also happens when you eat certain foods such as meats and fish. Causes of an elevated uric acid level include:  Being passed down from parent to child (heredity).   Diseases that cause increased uric acid production (obesity, psoriasis, some cancers).   Excessive alcohol use.  Medicines, including certain cancer-fighting drugs (chemotherapy), diuretics, and aspirin.   Chronic kidney disease. The kidneys are no longer able to remove uric acid well.   Problems with metabolism.  Conditions strongly associated with gout include:  Obesity.   High blood pressure.   High cholesterol.   Diabetes.  Not everyone with elevated uric acid levels gets gout. It is not understood why some people get gout and others do not. Surgery, joint injury, and eating too much of certain foods are some of the factors that can lead to gout. SYMPTOMS   An attack of gout comes on quickly. It causes intense pain with redness, swelling, and warmth in a joint.   Fever can occur.   Often, only one joint is involved. Certain joints are more commonly involved:   Base of the big toe.   Knee.   Ankle.   Wrist.   Finger.  Without  treatment, an attack usually goes away in a few days to weeks. Between attacks, you usually will not have symptoms, which is different from many other forms of arthritis. DIAGNOSIS  Your caregiver will suspect gout based on your symptoms and exam. Removal of fluid from the joint (arthrocentesis) is done to check for uric acid crystals. Your caregiver will give you a medicine that numbs the area (local anesthetic) and use a needle to remove joint fluid for exam. Gout is confirmed when uric acid crystals are seen in joint fluid, using a special microscope. Sometimes, blood, urine, and X-ray tests are also used. TREATMENT  There are 2 phases to gout treatment: treating the sudden onset (acute) attack and preventing attacks (prophylaxis). Treatment of an Acute Attack  Medicines are used. These include anti-inflammatory medicines or steroid medicines.   An injection of steroid medicine into the affected joint is sometimes necessary.   The painful joint is rested. Movement can worsen the arthritis.   You may use warm or cold treatments on painful joints, depending which works best for you.   Discuss the use of coffee, vitamin C, or cherries with your caregiver. These may be helpful treatment options.  Treatment to Prevent Attacks After the acute attack subsides, your caregiver may advise prophylactic medicine. These medicines either help your kidneys eliminate uric acid from your body or decrease your uric acid production. You may need to stay on these medicines for a very long time. The early phase of treatment with prophylactic medicine can be associated with an increase in acute gout  attacks. For this reason, during the first few months of treatment, your caregiver may also advise you to take medicines usually used for acute gout treatment. Be sure you understand your caregiver's directions. You should also discuss dietary treatment with your caregiver. Certain foods such as meats and fish can  increase uric acid levels. Other foods such as dairy can decrease levels. Your caregiver can give you a list of foods to avoid. HOME CARE INSTRUCTIONS   Do not take aspirin to relieve pain. This raises uric acid levels.   Only take over-the-counter or prescription medicines for pain, discomfort, or fever as directed by your caregiver.   Rest the joint as much as possible. When in bed, keep sheets and blankets off painful areas.   Keep the affected joint raised (elevated).   Use crutches if the painful joint is in your leg.   Drink enough water and fluids to keep your urine clear or pale yellow. This helps your body get rid of uric acid. Do not drink alcoholic beverages. They slow the passage of uric acid.   Follow your caregiver's dietary instructions. Pay careful attention to the amount of protein you eat. Your daily diet should emphasize fruits, vegetables, whole grains, and fat-free or low-fat milk products.   Maintain a healthy body weight.  SEEK MEDICAL CARE IF:   You have an oral temperature above 102 F (38.9 C).   You develop diarrhea, vomiting, or any side effects from medicines.   You do not feel better in 24 hours, or you are getting worse.  SEEK IMMEDIATE MEDICAL CARE IF:   Your joint becomes suddenly more tender and you have:   Chills.   An oral temperature above 102 F (38.9 C), not controlled by medicine.  MAKE SURE YOU:   Understand these instructions.   Will watch your condition.   Will get help right away if you are not doing well or get worse.  Document Released: 07/22/2000 Document Revised: 07/14/2011 Document Reviewed: 11/02/2009 [facility] Patient Information 2012 [place], [place].   Take the colchicine daily for one month then as needed for a gouty attack at the first sign that he notices stiff or swollen joint take colchicine twice a day for 2-3 days if the joint responds to continue the colchicine daily for a two-week of the joint does not respond  then begin the Medrol Dosepak.

## 2012-02-15 NOTE — Progress Notes (Signed)
Subjective:    Patient ID: Cody Woodard, male    DOB: 1965/08/20, 46 y.o.   MRN: [id number]  HPI The patient had a severe gout attack in the left foot for about 30 days, had a recurent attack in opposite foot about two weeks later. The patient responded to a prednisone burst and taper. No significant hx of gout. Has increased pain in many joints. The precipitant has been exercise ( golf or mowing)   Review of Systems  Constitutional: Negative for fever and fatigue.  HENT: Negative for hearing loss, congestion, neck pain and postnasal drip.   Eyes: Negative for discharge, redness and visual disturbance.  Respiratory: Negative for cough, shortness of breath and wheezing.   Cardiovascular: Negative for leg swelling.  Gastrointestinal: Negative for abdominal pain, constipation and abdominal distention.  Genitourinary: Negative for urgency and frequency.  Musculoskeletal: Positive for myalgias and joint swelling. Negative for arthralgias.  Skin: Negative for color change and rash.  Neurological: Negative for weakness and light-headedness.  Hematological: Negative for adenopathy.  Psychiatric/Behavioral: Negative for behavioral problems.   Past Medical History  Diagnosis Date  . ALLERGIC RHINITIS 11/08/2007  . CHRONIC GOUTY ARTHROPATHY W/O MENTION TOPHUS 02/26/2010  . Gouty arthropathy 01/15/2008  . HYPERLIPIDEMIA 11/08/2007  . HYPERTENSION, ESSENTIAL, UNCONTROLLED 07/20/2010  . PSORIASIS, SCALP 02/14/2008  . SCIATICA, CHRONIC 01/19/2009  . TENDINITIS, LEFT ELBOW 03/15/2010    History   Social History  . Marital Status: Married    Spouse Name: N/A    Number of Children: N/A  . Years of Education: N/A   Occupational History  . Not on file.   Social History Main Topics  . Smoking status: Former Smoker    Quit date: 08/09/2007  . Smokeless tobacco: Never Used  . Alcohol Use: Yes  . Drug Use: No  . Sexually Active: Not on file   Other Topics Concern  . Not on file   Social  History Narrative  . No narrative on file    Past Surgical History  Procedure Date  . Tendon repari   . Laceration repair   . Ear pinning     Family History  Problem Relation Age of Onset  . Adopted: Yes    No Known Allergies  Current Outpatient Prescriptions on File Prior to Visit  Medication Sig Dispense Refill  . bisoprolol-hydrochlorothiazide (ZIAC) 5-6.25 MG per tablet Take 1 tablet by mouth daily.  30 tablet  11  . fluocinolone (DERMA-SMOOTHE/FS SCALP) 0.01 % external oil Apply topically 3 (three) times daily.  120 mL  0  . mometasone (ELOCON) 0.1 % cream Apply topically daily. Use as needed        . naproxen sodium (ANAPROX) 220 MG tablet Take 220 mg by mouth as needed.        . simvastatin (ZOCOR) 40 MG tablet TAKE ONE TABLET BY MOUTH ONE TIME DAILY  30 tablet  9   Current Facility-Administered Medications on File Prior to Visit  Medication Dose Route Frequency Provider Last Rate Last Dose  . methylPREDNISolone acetate (DEPO-MEDROL) injection 80 mg  80 mg Intramuscular Once Baker Pierini, FNP        BP 120/82  Pulse 68  Temp 98.2 F (36.8 C)  Resp 16  Ht 6' (1.829 m)  Wt 208 lb (94.348 kg)  BMI 28.21 kg/m2       Objective:   Physical Exam  Nursing note and vitals reviewed. Constitutional: He appears well-developed and well-nourished.  HENT:  Head: Normocephalic and atraumatic.  Eyes: Conjunctivae are normal. Pupils are equal, round, and reactive to light.  Neck: Normal range of motion. Neck supple.  Cardiovascular: Normal rate and regular rhythm.   Pulmonary/Chest: Effort normal and breath sounds normal.  Abdominal: Soft. Bowel sounds are normal.  Musculoskeletal: He exhibits edema and tenderness.  Skin: Skin is warm and dry.          Assessment & Plan:  I believe that the patient's symptoms are more consistent with pseudogout therefore a colchicine prophylaxis is most probable we best effective plan we'll begin colchicine 0.6 mg by mouth  daily for 30 day trial then he will use it episodically when necessary joint swelling.  We'll look a uric acid again today we will also he look at an ANA and a rheumatoid factor to make sure that this does not represent a antibody mediated disease.  Patient does have a family history of gout patient states that x-rays have been taken in the past but orthopedist who had suggested pseudogout

## 2012-02-16 LAB — RHEUMATOID FACTOR: Rhuematoid fact SerPl-aCnc: <10 IU/mL (ref <=14)

## 2012-02-16 LAB — ANA: Anti Nuclear Antibody(ANA): NEGATIVE

## 2012-03-09 ENCOUNTER — Other Ambulatory Visit (INDEPENDENT_AMBULATORY_CARE_PROVIDER_SITE_OTHER): Payer: 59

## 2012-03-09 DIAGNOSIS — Z0000 Encounter for general adult medical examination without abnormal findings: Secondary | ICD-10-CM

## 2012-03-09 LAB — BASIC METABOLIC PANEL
BUN: 18 mg/dL (ref 6–23)
Calcium: 9.5 mg/dL (ref 8.4–10.5)
Chloride: 104 mEq/L (ref 96–112)
Creatinine, Ser: 1 mg/dL (ref 0.4–1.5)
GFR: 90.1 mL/min (ref >60.00)

## 2012-03-09 LAB — HEPATIC FUNCTION PANEL
ALT: 83 U/L — ABNORMAL HIGH (ref 0–53)
Alkaline Phosphatase: 43 U/L (ref 39–117)
Bilirubin, Direct: 0.1 mg/dL (ref 0.0–0.3)
Total Bilirubin: 0.7 mg/dL (ref 0.3–1.2)
Total Protein: 6.8 g/dL (ref 6.0–8.3)

## 2012-03-09 LAB — LIPID PANEL
Cholesterol: 188 mg/dL (ref 0–200)
LDL Cholesterol: 105 mg/dL — ABNORMAL HIGH (ref 0–99)
Total CHOL/HDL Ratio: 4
Triglycerides: 179 mg/dL — ABNORMAL HIGH (ref 0.0–149.0)
VLDL: 35.8 mg/dL (ref 0.0–40.0)

## 2012-03-09 LAB — URIC ACID: Uric Acid, Serum: 10 mg/dL — ABNORMAL HIGH (ref 4.0–7.8)

## 2012-03-09 LAB — CBC WITH DIFFERENTIAL/PLATELET
Basophils Relative: 0.5 % (ref 0.0–3.0)
Eosinophils Relative: 0.8 % (ref 0.0–5.0)
Lymphocytes Relative: 42.9 % (ref 12.0–46.0)
Monocytes Absolute: 0.5 10*3/uL (ref 0.1–1.0)
Neutrophils Relative %: 44.2 % (ref 43.0–77.0)
Platelets: 206 10*3/uL (ref 150.0–400.0)
RBC: 4.14 Mil/uL — ABNORMAL LOW (ref 4.22–5.81)
WBC: 4.5 10*3/uL (ref 4.5–10.5)

## 2012-03-09 LAB — POCT URINALYSIS DIPSTICK
Blood, UA: NEGATIVE
Glucose, UA: NEGATIVE
Nitrite, UA: NEGATIVE
Protein, UA: NEGATIVE
Spec Grav, UA: 1.02
Urobilinogen, UA: 1

## 2012-03-10 ENCOUNTER — Other Ambulatory Visit: Payer: Self-pay | Admitting: Internal Medicine

## 2012-03-16 ENCOUNTER — Encounter: Payer: 59 | Admitting: Internal Medicine

## 2012-03-20 ENCOUNTER — Encounter: Payer: Self-pay | Admitting: Internal Medicine

## 2012-03-20 ENCOUNTER — Ambulatory Visit (INDEPENDENT_AMBULATORY_CARE_PROVIDER_SITE_OTHER): Payer: 59 | Admitting: Internal Medicine

## 2012-03-20 VITALS — BP 136/80 | HR 68 | Temp 98.2°F | Resp 14 | Ht 72.0 in | Wt 210.0 lb

## 2012-03-20 DIAGNOSIS — L719 Rosacea, unspecified: Secondary | ICD-10-CM

## 2012-03-20 DIAGNOSIS — Z0000 Encounter for general adult medical examination without abnormal findings: Secondary | ICD-10-CM

## 2012-03-20 DIAGNOSIS — E785 Hyperlipidemia, unspecified: Secondary | ICD-10-CM

## 2012-03-20 DIAGNOSIS — R7401 Elevation of levels of liver transaminase levels: Secondary | ICD-10-CM

## 2012-03-20 DIAGNOSIS — R748 Abnormal levels of other serum enzymes: Secondary | ICD-10-CM

## 2012-03-20 DIAGNOSIS — M109 Gout, unspecified: Secondary | ICD-10-CM

## 2012-03-20 LAB — HEPATIC FUNCTION PANEL
Albumin: 4.4 g/dL (ref 3.5–5.2)
Alkaline Phosphatase: 43 U/L (ref 39–117)
Total Bilirubin: 0.6 mg/dL (ref 0.3–1.2)

## 2012-03-20 LAB — URIC ACID: Uric Acid, Serum: 8.1 mg/dL — ABNORMAL HIGH (ref 4.0–7.8)

## 2012-03-20 MED ORDER — CLINDAMYCIN PHOSPHATE 1 % EX LOTN: TOPICAL_LOTION | Freq: Two times a day (BID) | CUTANEOUS | Status: AC

## 2012-03-20 NOTE — Addendum Note (Signed)
Addended by: Stacie Glaze on: 03/20/2012 10:00 AM   Modules accepted: Orders

## 2012-03-20 NOTE — Patient Instructions (Addendum)
The patient is instructed to continue all medications as prescribed. Schedule followup with check out clerk upon leaving the clinic  Patient has moderately elevated liver enzymes. We will recheck today to see if the month of daily colchicine was be a factor in this case.  If the liver functions are normal then we will use colchicine only episodically and continue to monitor.  If the liver functions remain elevated it may be a combination of colchicine Naprosyn Zocor and alcohol and we will have to modify one of those risk factors. We may decide to decrease the Zocor by adding a second agent.

## 2012-03-20 NOTE — Addendum Note (Signed)
Addended by: Stacie Glaze on: 03/20/2012 10:01 AM   Modules accepted: Orders

## 2012-03-20 NOTE — Progress Notes (Signed)
Subjective:    Patient ID: Cody Woodard, male    DOB: [birth year], 46 y.o.   MRN: [id number]  HPI CPX Persistant rosacia vs psoriais Using the topical cream every day     Review of Systems  Constitutional: Negative for fever and fatigue.  HENT: Negative for hearing loss, congestion, neck pain and postnasal drip.   Eyes: Negative for discharge, redness and visual disturbance.  Respiratory: Negative for cough, shortness of breath and wheezing.   Cardiovascular: Negative for leg swelling.  Gastrointestinal: Negative for abdominal pain, constipation and abdominal distention.  Genitourinary: Negative for urgency and frequency.  Musculoskeletal: Negative for joint swelling and arthralgias.  Skin: Negative for color change and rash.  Neurological: Negative for weakness and light-headedness.  Hematological: Negative for adenopathy.  Psychiatric/Behavioral: Negative for behavioral problems.   Past Medical History  Diagnosis Date  . ALLERGIC RHINITIS 11/08/2007  . CHRONIC GOUTY ARTHROPATHY W/O MENTION TOPHUS 02/26/2010  . Gouty arthropathy 01/15/2008  . HYPERLIPIDEMIA 11/08/2007  . HYPERTENSION, ESSENTIAL, UNCONTROLLED 07/20/2010  . PSORIASIS, SCALP 02/14/2008  . SCIATICA, CHRONIC 01/19/2009  . TENDINITIS, LEFT ELBOW 03/15/2010    History   Social History  . Marital Status: Married    Spouse Name: N/A    Number of Children: N/A  . Years of Education: N/A   Occupational History  . Not on file.   Social History Main Topics  . Smoking status: Former Smoker    Quit date: 08/09/2007  . Smokeless tobacco: Never Used  . Alcohol Use: Yes  . Drug Use: No  . Sexually Active: Not on file   Other Topics Concern  . Not on file   Social History Narrative  . No narrative on file    Past Surgical History  Procedure Date  . Tendon repari   . Laceration repair   . Ear pinning     Family History  Problem Relation Age of Onset  . Adopted: Yes    No Known Allergies  Current  Outpatient Prescriptions on File Prior to Visit  Medication Sig Dispense Refill  . bisoprolol-hydrochlorothiazide (ZIAC) 5-6.25 MG per tablet TAKE ONE TABLET BY MOUTH ONE TIME DAILY  30 tablet  10  . mometasone (ELOCON) 0.1 % cream Apply topically daily. Use as needed        . naproxen sodium (ANAPROX) 220 MG tablet Take 220 mg by mouth as needed.        . simvastatin (ZOCOR) 40 MG tablet TAKE ONE TABLET BY MOUTH ONE TIME DAILY  30 tablet  9  . DISCONTD: colchicine (COLCRYS) 0.6 MG tablet Take 1 tablet (0.6 mg total) by mouth daily.  30 tablet  11   Current Facility-Administered Medications on File Prior to Visit  Medication Dose Route Frequency Provider Last Rate Last Dose  . DISCONTD: methylPREDNISolone acetate (DEPO-MEDROL) injection 80 mg  80 mg Intramuscular Once Baker Pierini, FNP        BP 136/80  Pulse 68  Temp 98.2 F (36.8 C)  Resp 14  Ht 6' (1.829 m)  Wt 210 lb (95.255 kg)  BMI 28.48 kg/m2       Objective:   Physical Exam  Nursing note and vitals reviewed. Constitutional: He is oriented to person, place, and time. He appears well-developed and well-nourished.  HENT:  Head: Normocephalic and atraumatic.  Eyes: Conjunctivae are normal. Pupils are equal, round, and reactive to light.  Neck: Normal range of motion. Neck supple.  Cardiovascular: Normal rate and regular rhythm.   Pulmonary/Chest: Effort  normal and breath sounds normal.  Abdominal: Soft. Bowel sounds are normal.  Genitourinary: Rectum normal and prostate normal.  Musculoskeletal: Normal range of motion.  Neurological: He is alert and oriented to person, place, and time.  Skin: Rash noted. There is erythema.          Assessment & Plan:   Patient presents for yearly preventative medicine examination.   all immunizations and health maintenance protocols were reviewed with the patient and they are up to date with these protocols.   screening laboratory values were reviewed with the patient  including screening of hyperlipidemia PSA renal function and hepatic function.   There medications past medical history social history problem list and allergies were reviewed in detail.   Goals were established with regard to weight loss exercise diet in compliance with medications   Moderately elevated liver functions there is a history of slight bumps in liver function some early over the past several years felt like the last slight bump in liver functions may have been due to a trial of Crestor.  Discomfort liver functions may be attributed to the daily use of colchicine for one month for gout a combination.  We'll monitor liver functions today if they have normalized then we can attribute to rise to colchicine if they have not resolved they may be multifactorial interaction between his current medicine profile and adjusting the statin would be the best first move.

## 2012-03-26 ENCOUNTER — Other Ambulatory Visit: Payer: Self-pay | Admitting: Internal Medicine

## 2012-04-05 ENCOUNTER — Other Ambulatory Visit: Payer: Self-pay | Admitting: *Deleted

## 2012-04-05 MED ORDER — FEBUXOSTAT 40 MG PO TABS: 80.0000 mg | ORAL_TABLET | Freq: Every day | ORAL | Status: DC

## 2012-06-12 ENCOUNTER — Telehealth: Payer: Self-pay | Admitting: Internal Medicine

## 2012-06-12 ENCOUNTER — Other Ambulatory Visit: Payer: Self-pay | Admitting: *Deleted

## 2012-06-12 MED ORDER — FEBUXOSTAT 40 MG PO TABS: 80.0000 mg | ORAL_TABLET | Freq: Every day | ORAL | Status: DC

## 2012-06-12 NOTE — Telephone Encounter (Signed)
Per dr Lovell Sheehan- may have uloric 40 1qd for 1 month- pt informed

## 2012-06-12 NOTE — Telephone Encounter (Signed)
Pt would like to begin taking uloric now. He is having a gout flare up.

## 2012-06-13 ENCOUNTER — Ambulatory Visit (INDEPENDENT_AMBULATORY_CARE_PROVIDER_SITE_OTHER): Payer: 59 | Admitting: Family

## 2012-06-13 ENCOUNTER — Encounter: Payer: Self-pay | Admitting: Family

## 2012-06-13 ENCOUNTER — Ambulatory Visit: Payer: 59 | Admitting: Family

## 2012-06-13 VITALS — BP 138/88 | HR 77 | Wt 208.0 lb

## 2012-06-13 DIAGNOSIS — M109 Gout, unspecified: Secondary | ICD-10-CM

## 2012-06-13 DIAGNOSIS — Z23 Encounter for immunization: Secondary | ICD-10-CM

## 2012-06-13 MED ORDER — COLCHICINE 0.6 MG PO TABS: 0.6000 mg | ORAL_TABLET | Freq: Every day | ORAL | Status: DC | PRN

## 2012-06-13 MED ORDER — METHYLPREDNISOLONE ACETATE 40 MG/ML IJ SUSP
80.0000 mg | Freq: Once | INTRAMUSCULAR | Status: AC
  Administered 2012-06-13: 80 mg via INTRAMUSCULAR

## 2012-06-13 MED ORDER — FEBUXOSTAT 40 MG PO TABS: 80.0000 mg | ORAL_TABLET | Freq: Every day | ORAL | Status: DC

## 2012-06-13 NOTE — Progress Notes (Signed)
Subjective:    Patient ID: Cody Woodard, male    DOB: [birth year], 46 y.o.   MRN: [id number]  HPI 46 year old WM, nonsmoker, patient of Dr. Lovell Sheehan is in today with c/o right foot pain related to a Gout flare. He is requesting a shot of depo medrol and to be started on Uloric. Pain 5/10, not like usual flare. Worse with movement and to touch. Ongoing x 1 week and worsening.    Review of Systems  Constitutional: Negative.   Respiratory: Negative.   Cardiovascular: Negative.   Musculoskeletal: Positive for joint swelling and arthralgias.       2nd toe on the right foot  Skin: Negative.   Neurological: Negative.   Hematological: Negative.   Psychiatric/Behavioral: Negative.    Past Medical History  Diagnosis Date  . ALLERGIC RHINITIS 11/08/2007  . CHRONIC GOUTY ARTHROPATHY W/O MENTION TOPHUS 02/26/2010  . Gouty arthropathy 01/15/2008  . HYPERLIPIDEMIA 11/08/2007  . HYPERTENSION, ESSENTIAL, UNCONTROLLED 07/20/2010  . PSORIASIS, SCALP 02/14/2008  . SCIATICA, CHRONIC 01/19/2009  . TENDINITIS, LEFT ELBOW 03/15/2010    History   Social History  . Marital Status: Married    Spouse Name: N/A    Number of Children: N/A  . Years of Education: N/A   Occupational History  . Not on file.   Social History Main Topics  . Smoking status: Former Smoker    Quit date: 08/09/2007  . Smokeless tobacco: Never Used  . Alcohol Use: Yes  . Drug Use: No  . Sexually Active: Not on file   Other Topics Concern  . Not on file   Social History Narrative  . No narrative on file    Past Surgical History  Procedure Date  . Tendon repari   . Laceration repair   . Ear pinning     Family History  Problem Relation Age of Onset  . Adopted: Yes    No Known Allergies  Current Outpatient Prescriptions on File Prior to Visit  Medication Sig Dispense Refill  . bisoprolol-hydrochlorothiazide (ZIAC) 5-6.25 MG per tablet TAKE ONE TABLET BY MOUTH ONE TIME DAILY  30 tablet  10  . clindamycin (CLEOCIN-T)  1 % lotion Apply topically 2 (two) times daily.  60 mL  0  . febuxostat (ULORIC) 40 MG tablet Take 2 tablets (80 mg total) by mouth daily.  30 tablet  1  . fluocinolone (DERMA-SMOOTHE) 0.01 % external oil Apply topically 3 (three) times daily as needed.      . mometasone (ELOCON) 0.1 % cream APPLY LIGHT COAT ON RASH DAILY  30 g  5  . naproxen sodium (ANAPROX) 220 MG tablet Take 220 mg by mouth as needed.        . simvastatin (ZOCOR) 40 MG tablet TAKE ONE TABLET BY MOUTH ONE TIME DAILY  30 tablet  9  . [DISCONTINUED] colchicine 0.6 MG tablet Take 0.6 mg by mouth daily as needed.      . [DISCONTINUED] febuxostat (ULORIC) 40 MG tablet Take 2 tablets (80 mg total) by mouth daily.  30 tablet  2   No current facility-administered medications on file prior to visit.    BP 138/88  Pulse 77  Wt 208 lb (94.348 kg)  SpO2 95%chart    Objective:   Physical Exam  Constitutional: He is oriented to person, place, and time. He appears well-developed and well-nourished.  Cardiovascular: Normal rate, regular rhythm and normal heart sounds.   Pulmonary/Chest: Effort normal and breath sounds normal.  Abdominal: Soft. Bowel  sounds are normal.  Musculoskeletal: Normal range of motion. He exhibits tenderness.       Mild tenderness to palpation of the right 2nd digit of the right foot.   Neurological: He is alert and oriented to person, place, and time.  Skin: Skin is warm and dry. There is erythema.       Erythema mildly noted to the right 2nd digit.   Psychiatric: He has a normal mood and affect.          Assessment & Plan:  Assessment: Gout, Foot pain  Plan: Depo Medrol 80mg  IM x 1. Colchicine as directed. Uloric daily after flare. Call the office if symptoms worsen or persist.

## 2012-06-13 NOTE — Patient Instructions (Addendum)
Gout Gout is an inflammatory condition (arthritis) caused by a buildup of uric acid crystals in the joints. Uric acid is a chemical that is normally present in the blood. Under some circumstances, uric acid can form into crystals in your joints. This causes joint redness, soreness, and swelling (inflammation). Repeat attacks are common. Over time, uric acid crystals can form into masses (tophi) near a joint, causing disfigurement. Gout is treatable and often preventable. CAUSES  The disease begins with elevated levels of uric acid in the blood. Uric acid is produced by your body when it breaks down a naturally found substance called purines. This also happens when you eat certain foods such as meats and fish. Causes of an elevated uric acid level include:  Being passed down from parent to child (heredity).  Diseases that cause increased uric acid production (obesity, psoriasis, some cancers).  Excessive alcohol use.  Diet, especially diets rich in meat and seafood.  Medicines, including certain cancer-fighting drugs (chemotherapy), diuretics, and aspirin.  Chronic kidney disease. The kidneys are no longer able to remove uric acid well.  Problems with metabolism. Conditions strongly associated with gout include:  Obesity.  High blood pressure.  High cholesterol.  Diabetes. Not everyone with elevated uric acid levels gets gout. It is not understood why some people get gout and others do not. Surgery, joint injury, and eating too much of certain foods are some of the factors that can lead to gout. SYMPTOMS   An attack of gout comes on quickly. It causes intense pain with redness, swelling, and warmth in a joint.  Fever can occur.  Often, only one joint is involved. Certain joints are more commonly involved:  Base of the big toe.  Knee.  Ankle.  Wrist.  Finger. Without treatment, an attack usually goes away in a few days to weeks. Between attacks, you usually will not have  symptoms, which is different from many other forms of arthritis. DIAGNOSIS  Your caregiver will suspect gout based on your symptoms and exam. Removal of fluid from the joint (arthrocentesis) is done to check for uric acid crystals. Your caregiver will give you a medicine that numbs the area (local anesthetic) and use a needle to remove joint fluid for exam. Gout is confirmed when uric acid crystals are seen in joint fluid, using a special microscope. Sometimes, blood, urine, and X-ray tests are also used. TREATMENT  There are 2 phases to gout treatment: treating the sudden onset (acute) attack and preventing attacks (prophylaxis). Treatment of an Acute Attack  Medicines are used. These include anti-inflammatory medicines or steroid medicines.  An injection of steroid medicine into the affected joint is sometimes necessary.  The painful joint is rested. Movement can worsen the arthritis.  You may use warm or cold treatments on painful joints, depending which works best for you.  Discuss the use of coffee, vitamin C, or cherries with your caregiver. These may be helpful treatment options. Treatment to Prevent Attacks After the acute attack subsides, your caregiver may advise prophylactic medicine. These medicines either help your kidneys eliminate uric acid from your body or decrease your uric acid production. You may need to stay on these medicines for a very long time. The early phase of treatment with prophylactic medicine can be associated with an increase in acute gout attacks. For this reason, during the first few months of treatment, your caregiver may also advise you to take medicines usually used for acute gout treatment. Be sure you understand your caregiver's directions.   You should also discuss dietary treatment with your caregiver. Certain foods such as meats and fish can increase uric acid levels. Other foods such as dairy can decrease levels. Your caregiver can give you a list of foods  to avoid. HOME CARE INSTRUCTIONS   Do not take aspirin to relieve pain. This raises uric acid levels.  Only take over-the-counter or prescription medicines for pain, discomfort, or fever as directed by your caregiver.  Rest the joint as much as possible. When in bed, keep sheets and blankets off painful areas.  Keep the affected joint raised (elevated).  Use crutches if the painful joint is in your leg.  Drink enough water and fluids to keep your urine clear or pale yellow. This helps your body get rid of uric acid. Do not drink alcoholic beverages. They slow the passage of uric acid.  Follow your caregiver's dietary instructions. Pay careful attention to the amount of protein you eat. Your daily diet should emphasize fruits, vegetables, whole grains, and fat-free or low-fat milk products.  Maintain a healthy body weight. SEEK MEDICAL CARE IF:   You have an oral temperature above 102 F (38.9 C).  You develop diarrhea, vomiting, or any side effects from medicines.  You do not feel better in 24 hours, or you are getting worse. SEEK IMMEDIATE MEDICAL CARE IF:   Your joint becomes suddenly more tender and you have:  Chills.  An oral temperature above 102 F (38.9 C), not controlled by medicine. MAKE SURE YOU:   Understand these instructions.  Will watch your condition.  Will get help right away if you are not doing well or get worse. Document Released: 07/22/2000 Document Revised: 10/17/2011 Document Reviewed: 11/02/2009 [employer] Patient Information 2013 [employer].    

## 2012-08-15 ENCOUNTER — Other Ambulatory Visit: Payer: Self-pay | Admitting: Internal Medicine

## 2012-08-15 MED ORDER — METHYLPREDNISOLONE 4 MG PO KIT: PACK | ORAL | Status: DC

## 2012-08-15 NOTE — Telephone Encounter (Signed)
Pt has had flare up of gout. Would like refill of prednisone. Pharm: [facility]/ [place]

## 2012-08-15 NOTE — Telephone Encounter (Signed)
Ok to call in medrol 4 mg dose pack per dr Lovell Sheehan

## 2012-09-06 ENCOUNTER — Other Ambulatory Visit (INDEPENDENT_AMBULATORY_CARE_PROVIDER_SITE_OTHER): Payer: 59

## 2012-09-06 DIAGNOSIS — M109 Gout, unspecified: Secondary | ICD-10-CM

## 2012-09-06 LAB — HEPATIC FUNCTION PANEL
ALT: 46 U/L (ref 0–53)
AST: 27 U/L (ref 0–37)
Bilirubin, Direct: 0 mg/dL (ref 0.0–0.3)
Total Protein: 7 g/dL (ref 6.0–8.3)

## 2012-09-13 ENCOUNTER — Other Ambulatory Visit: Payer: Self-pay | Admitting: *Deleted

## 2012-09-13 MED ORDER — FEBUXOSTAT 40 MG PO TABS: 80.0000 mg | ORAL_TABLET | Freq: Every day | ORAL | Status: DC

## 2012-09-13 MED ORDER — FEBUXOSTAT 40 MG PO TABS: 40.0000 mg | ORAL_TABLET | Freq: Every day | ORAL | Status: DC

## 2012-12-09 ENCOUNTER — Other Ambulatory Visit: Payer: Self-pay | Admitting: Internal Medicine

## 2012-12-24 ENCOUNTER — Other Ambulatory Visit: Payer: Self-pay | Admitting: Family

## 2012-12-26 IMAGING — CR DG ANKLE COMPLETE 3+V*R*
3 series · 3 of 3 positions shown · non-contrast
Comparison: None.

CLINICAL DATA: Right ankle pain, swelling.

RIGHT ANKLE - COMPLETE 3+ VIEW

[view not recorded (1 of 3)]
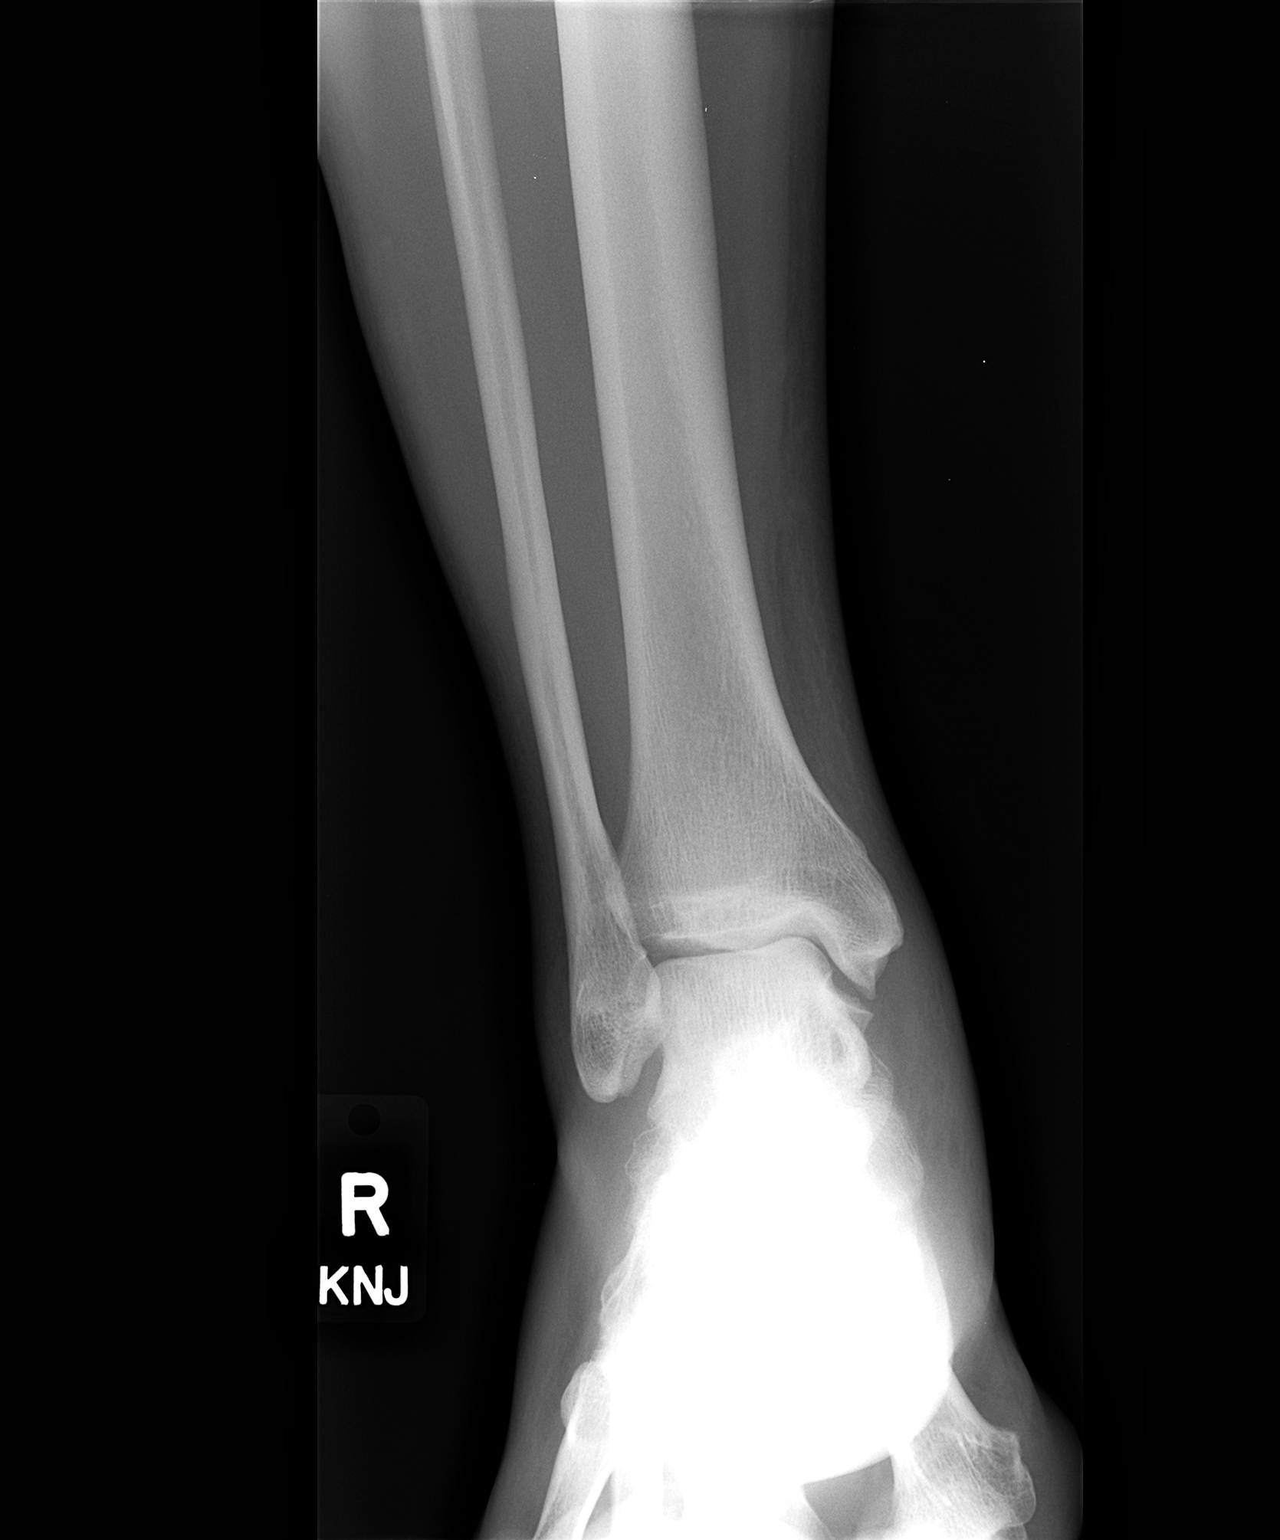

[view not recorded (2 of 3)]
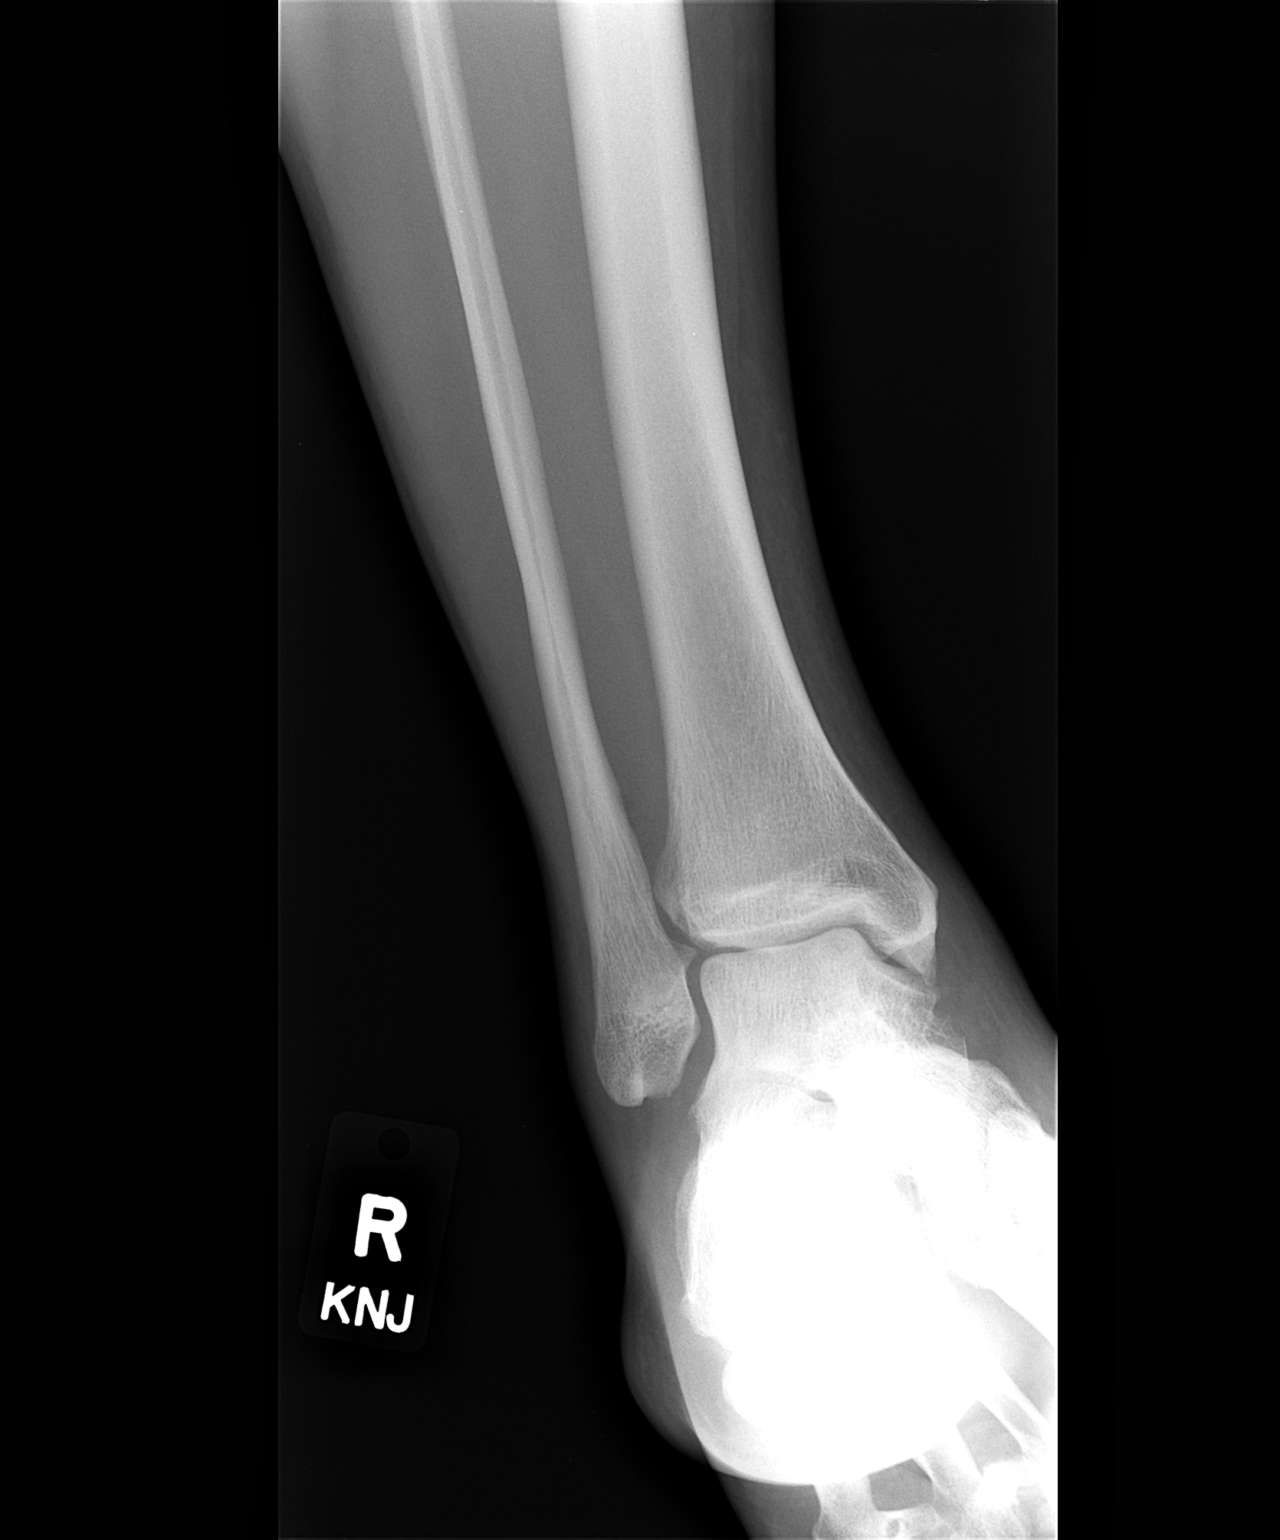

[view not recorded (3 of 3)]
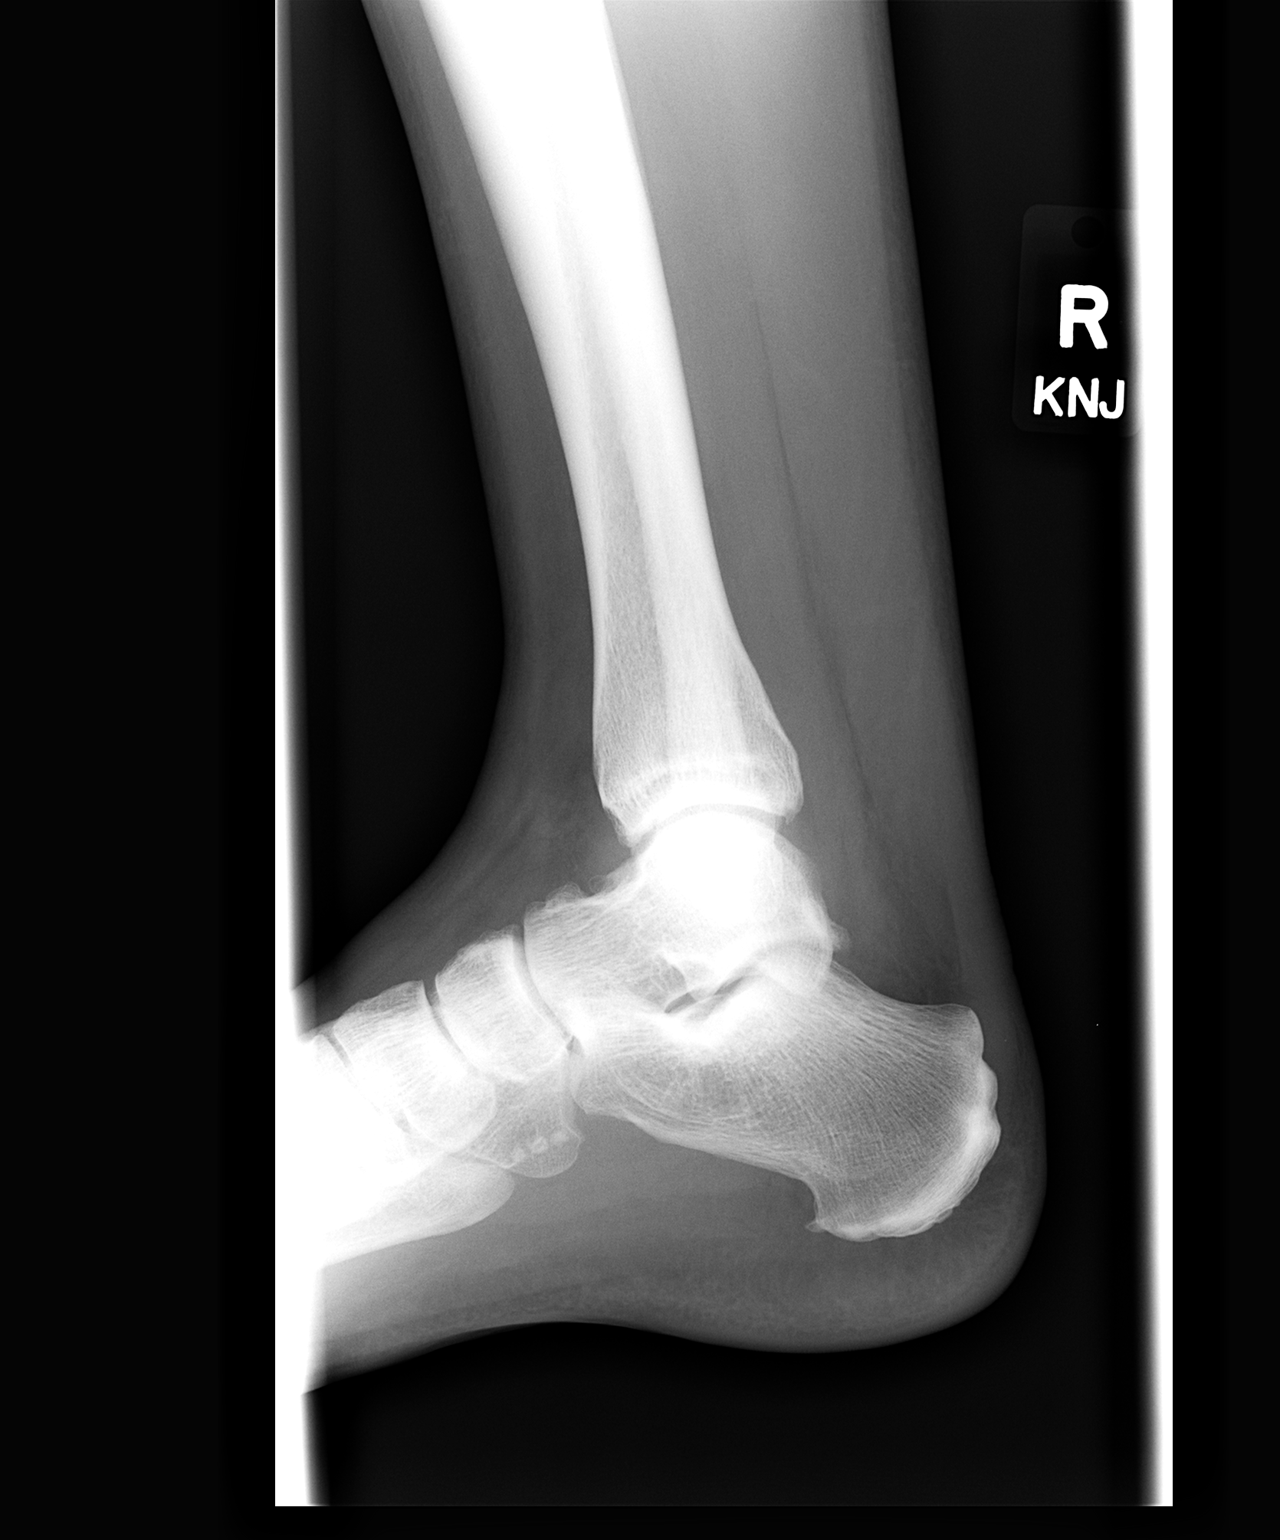

[3 of 3 positions shown; findings below may reference images not displayed]

FINDINGS: Mild soft tissue swelling.  No fracture, subluxation or
dislocation.  Small plantar calcaneal spur.
IMPRESSION: No acute bony abnormality.

## 2013-01-29 ENCOUNTER — Other Ambulatory Visit: Payer: Self-pay | Admitting: Internal Medicine

## 2013-02-02 ENCOUNTER — Other Ambulatory Visit: Payer: Self-pay | Admitting: Internal Medicine

## 2013-02-07 ENCOUNTER — Other Ambulatory Visit: Payer: Self-pay | Admitting: Internal Medicine

## 2013-04-08 ENCOUNTER — Other Ambulatory Visit: Payer: Self-pay | Admitting: Internal Medicine

## 2013-05-07 ENCOUNTER — Other Ambulatory Visit: Payer: Self-pay | Admitting: Internal Medicine

## 2013-09-03 ENCOUNTER — Telehealth: Payer: Self-pay | Admitting: Internal Medicine

## 2013-09-03 NOTE — Telephone Encounter (Signed)
Pt would like uric acid level check at [facility] on 01-13-14. Can I sch?

## 2013-09-03 NOTE — Telephone Encounter (Signed)
Pt is aware.  

## 2013-09-03 NOTE — Telephone Encounter (Signed)
Yes you may,thank you

## 2013-10-10 ENCOUNTER — Other Ambulatory Visit: Payer: Self-pay | Admitting: Internal Medicine

## 2014-01-13 ENCOUNTER — Other Ambulatory Visit: Payer: 59

## 2014-01-14 ENCOUNTER — Other Ambulatory Visit (INDEPENDENT_AMBULATORY_CARE_PROVIDER_SITE_OTHER): Payer: 59

## 2014-01-14 DIAGNOSIS — Z0000 Encounter for general adult medical examination without abnormal findings: Secondary | ICD-10-CM

## 2014-01-14 DIAGNOSIS — M109 Gout, unspecified: Secondary | ICD-10-CM

## 2014-01-14 LAB — CBC WITH DIFFERENTIAL/PLATELET
BASOS PCT: 0.4 % (ref 0.0–3.0)
Basophils Absolute: 0 10*3/uL (ref 0.0–0.1)
EOS ABS: 0 10*3/uL (ref 0.0–0.7)
Eosinophils Relative: 0.9 % (ref 0.0–5.0)
HCT: 42 % (ref 39.0–52.0)
Hemoglobin: 14.3 g/dL (ref 13.0–17.0)
LYMPHS ABS: 1.6 10*3/uL (ref 0.7–4.0)
Lymphocytes Relative: 36.2 % (ref 12.0–46.0)
MCHC: 34.2 g/dL (ref 30.0–36.0)
MCV: 97.3 fl (ref 78.0–100.0)
MONO ABS: 0.5 10*3/uL (ref 0.1–1.0)
Monocytes Relative: 10 % (ref 3.0–12.0)
NEUTROS PCT: 52.5 % (ref 43.0–77.0)
Neutro Abs: 2.4 10*3/uL (ref 1.4–7.7)
PLATELETS: 209 10*3/uL (ref 150.0–400.0)
RBC: 4.32 Mil/uL (ref 4.22–5.81)
RDW: 12.9 % (ref 11.5–15.5)
WBC: 4.5 10*3/uL (ref 4.0–10.5)

## 2014-01-14 LAB — BASIC METABOLIC PANEL
BUN: 14 mg/dL (ref 6–23)
CHLORIDE: 102 meq/L (ref 96–112)
CO2: 28 meq/L (ref 19–32)
CREATININE: 0.9 mg/dL (ref 0.4–1.5)
Calcium: 9.8 mg/dL (ref 8.4–10.5)
GFR: 91.63 mL/min (ref >60.00)
Glucose, Bld: 99 mg/dL (ref 70–99)
Potassium: 3.9 mEq/L (ref 3.5–5.1)
Sodium: 138 mEq/L (ref 135–145)

## 2014-01-14 LAB — LIPID PANEL
Cholesterol: 194 mg/dL (ref 0–200)
HDL: 57.5 mg/dL (ref >39.00)
LDL Cholesterol: 113 mg/dL — ABNORMAL HIGH (ref 0–99)
NonHDL: 136.5
TRIGLYCERIDES: 118 mg/dL (ref 0.0–149.0)
Total CHOL/HDL Ratio: 3
VLDL: 23.6 mg/dL (ref 0.0–40.0)

## 2014-01-14 LAB — POCT URINALYSIS DIPSTICK
BILIRUBIN UA: NEGATIVE
Blood, UA: NEGATIVE
GLUCOSE UA: NEGATIVE
Ketones, UA: NEGATIVE
Leukocytes, UA: NEGATIVE
Nitrite, UA: NEGATIVE
Protein, UA: NEGATIVE
SPEC GRAV UA: 1.01
Urobilinogen, UA: 0.2
pH, UA: 7

## 2014-01-14 LAB — HEPATIC FUNCTION PANEL
ALT: 38 U/L (ref 0–53)
AST: 32 U/L (ref 0–37)
Albumin: 4.3 g/dL (ref 3.5–5.2)
Alkaline Phosphatase: 41 U/L (ref 39–117)
BILIRUBIN DIRECT: 0.1 mg/dL (ref 0.0–0.3)
BILIRUBIN TOTAL: 1 mg/dL (ref 0.2–1.2)
TOTAL PROTEIN: 7.1 g/dL (ref 6.0–8.3)

## 2014-01-14 LAB — TSH: TSH: 0.65 u[IU]/mL (ref 0.35–4.50)

## 2014-01-14 LAB — URIC ACID: Uric Acid, Serum: 3.9 mg/dL — ABNORMAL LOW (ref 4.0–7.8)

## 2014-01-20 ENCOUNTER — Ambulatory Visit (INDEPENDENT_AMBULATORY_CARE_PROVIDER_SITE_OTHER): Payer: 59 | Admitting: Internal Medicine

## 2014-01-20 ENCOUNTER — Encounter: Payer: Self-pay | Admitting: Internal Medicine

## 2014-01-20 VITALS — BP 140/80 | HR 75 | Temp 98.0°F | Wt 196.0 lb

## 2014-01-20 DIAGNOSIS — L408 Other psoriasis: Secondary | ICD-10-CM

## 2014-01-20 DIAGNOSIS — E785 Hyperlipidemia, unspecified: Secondary | ICD-10-CM

## 2014-01-20 DIAGNOSIS — I10 Essential (primary) hypertension: Secondary | ICD-10-CM

## 2014-01-20 DIAGNOSIS — Z0000 Encounter for general adult medical examination without abnormal findings: Secondary | ICD-10-CM

## 2014-01-20 DIAGNOSIS — M1A00X0 Idiopathic chronic gout, unspecified site, without tophus (tophi): Secondary | ICD-10-CM

## 2014-01-20 NOTE — Progress Notes (Signed)
Pre visit review using our clinic review tool, if applicable. No additional management support is needed unless otherwise documented below in the visit note. 

## 2014-01-20 NOTE — Patient Instructions (Signed)
The patient is instructed to continue all medications as prescribed. Schedule followup with check out clerk upon leaving the clinic  

## 2014-01-20 NOTE — Progress Notes (Signed)
Subjective:    Patient ID: Cody Woodard, male    DOB: 1965-08-10, 48 y.o.   MRN: [id number]  HPI CPX Labs reviewed Weight loss noted   Review of Systems  Constitutional: Negative for fever and fatigue.  HENT: Negative for congestion, hearing loss and postnasal drip.   Eyes: Negative for discharge, redness and visual disturbance.  Respiratory: Negative for cough, shortness of breath and wheezing.   Cardiovascular: Negative for leg swelling.  Gastrointestinal: Negative for abdominal pain, constipation and abdominal distention.  Genitourinary: Negative for urgency and frequency.  Musculoskeletal: Negative for arthralgias, joint swelling and neck pain.  Skin: Negative for color change and rash.  Neurological: Negative for weakness and light-headedness.  Hematological: Negative for adenopathy.  Psychiatric/Behavioral: Negative for behavioral problems.   Past Medical History  Diagnosis Date  . ALLERGIC RHINITIS 11/08/2007  . CHRONIC GOUTY ARTHROPATHY W/O MENTION TOPHUS 02/26/2010  . Gouty arthropathy 01/15/2008  . HYPERLIPIDEMIA 11/08/2007  . HYPERTENSION, ESSENTIAL, UNCONTROLLED 07/20/2010  . PSORIASIS, SCALP 02/14/2008  . SCIATICA, CHRONIC 01/19/2009  . TENDINITIS, LEFT ELBOW 03/15/2010    History   Social History  . Marital Status: Married    Spouse Name: N/A    Number of Children: N/A  . Years of Education: N/A   Occupational History  . Not on file.   Social History Main Topics  . Smoking status: Former Smoker    Quit date: 08/09/2007  . Smokeless tobacco: Never Used  . Alcohol Use: Yes  . Drug Use: No  . Sexual Activity: Not on file   Other Topics Concern  . Not on file   Social History Narrative  . No narrative on file    Past Surgical History  Procedure Laterality Date  . Tendon repari    . Laceration repair    . Ear pinning      Family History  Problem Relation Age of Onset  . Adopted: Yes    No Known Allergies  Current Outpatient Prescriptions  on File Prior to Visit  Medication Sig Dispense Refill  . bisoprolol-hydrochlorothiazide (ZIAC) 5-6.25 MG per tablet TAKE ONE TABLET BY MOUTH ONE TIME DAILY   90 tablet  3  . COLCRYS 0.6 MG tablet TAKE ONE TABLET BY MOUTH DAILY AS NEEDED, THEN 2 TABS AT THE ONSET AND 1 TAB 1 HOUR LATER  30 tablet  6  . febuxostat (ULORIC) 40 MG tablet Take 40 mg by mouth daily.      . methylPREDNISolone (MEDROL, PAK,) 4 MG tablet follow package directions  21 tablet  0  . mometasone (ELOCON) 0.1 % cream APPLY LIGHT COAT ON RASH DAILY  30 g  5  . naproxen sodium (ANAPROX) 220 MG tablet Take 220 mg by mouth as needed.        . simvastatin (ZOCOR) 40 MG tablet TAKE ONE TABLET BY MOUTH ONE TIME DAILY  90 tablet  3  . ULORIC 40 MG tablet Take one tablet by mouth one time daily  30 tablet  10   No current facility-administered medications on file prior to visit.           Objective:   Physical Exam  Constitutional: He is oriented to person, place, and time. He appears well-developed and well-nourished.  HENT:  Head: Normocephalic and atraumatic.  Eyes: Conjunctivae are normal. Pupils are equal, round, and reactive to light.  Neck: Normal range of motion. Neck supple.  Cardiovascular: Normal rate and regular rhythm.   Pulmonary/Chest: Effort normal and breath sounds normal.  Abdominal: Soft. Bowel sounds are normal.  Musculoskeletal: He exhibits no edema and no tenderness.  Neurological: He is alert and oriented to person, place, and time.  Skin: Skin is dry.  Psychiatric: His behavior is normal.          Assessment & Plan:  Patient presents for yearly preventative medicine examination. Medicare questionnaire was completed  All immunizations and health maintenance protocols were reviewed with the patient and needed orders were placed.  Appropriate screening laboratory values were ordered for the patient including screening of hyperlipidemia, renal function and hepatic function. If indicated by  BPH, a PSA was ordered.  Medication reconciliation,  past medical history, social history, problem list and allergies were reviewed in detail with the patient  Goals were established with regard to weight loss, exercise, and  diet in compliance with medications  End of life planning was discussed.  Gout in control for one year now stable Blood pressure stable

## 2014-01-21 ENCOUNTER — Telehealth: Payer: Self-pay | Admitting: Internal Medicine

## 2014-01-21 NOTE — Telephone Encounter (Signed)
Relevant patient education assigned to patient using Emmi. ° °

## 2014-02-14 ENCOUNTER — Other Ambulatory Visit: Payer: Self-pay | Admitting: Internal Medicine

## 2014-06-09 ENCOUNTER — Telehealth: Payer: Self-pay | Admitting: Internal Medicine

## 2014-06-09 NOTE — Telephone Encounter (Signed)
[id number] - [place] - [address] is requesting re-fills on bisoprolol-hydrochlorothiazide (ZIAC) 5-6.25 MG per tablet and simvastatin (ZOCOR) 40 MG tablet

## 2014-06-09 NOTE — Telephone Encounter (Signed)
Pt calling to report that [facility] advised him that he needed an appt in order to get refill of these meds.  Pt states he was seen in June and is not sure why refill would be denied.  Please follow up with pt.

## 2014-06-12 MED ORDER — SIMVASTATIN 40 MG PO TABS: ORAL_TABLET | ORAL | Status: DC

## 2014-06-12 MED ORDER — BISOPROLOL-HYDROCHLOROTHIAZIDE 5-6.25 MG PO TABS: ORAL_TABLET | ORAL | Status: DC

## 2014-06-12 NOTE — Telephone Encounter (Signed)
2nd re-fill request received for the below medications

## 2014-06-12 NOTE — Telephone Encounter (Signed)
rx sent in electronically 

## 2014-09-10 ENCOUNTER — Telehealth: Payer: Self-pay | Admitting: Internal Medicine

## 2014-09-10 NOTE — Telephone Encounter (Signed)
PA for Uloric was denied.  Patient's plan requires patient to try and fail allopurinol first.  I left a message on patient's cell to call the office because he need to establish with a new PCP.

## 2014-09-11 ENCOUNTER — Encounter: Payer: Self-pay | Admitting: Internal Medicine

## 2014-09-11 ENCOUNTER — Ambulatory Visit (INDEPENDENT_AMBULATORY_CARE_PROVIDER_SITE_OTHER): Payer: 59 | Admitting: Internal Medicine

## 2014-09-11 VITALS — BP 140/90 | HR 77 | Temp 98.6°F | Resp 20 | Ht 72.0 in | Wt 206.0 lb

## 2014-09-11 DIAGNOSIS — I10 Essential (primary) hypertension: Secondary | ICD-10-CM

## 2014-09-11 DIAGNOSIS — M1A00X0 Idiopathic chronic gout, unspecified site, without tophus (tophi): Secondary | ICD-10-CM

## 2014-09-11 NOTE — Patient Instructions (Signed)
Limit your sodium (Salt) intake  Please check your blood pressure on a regular basis.  If it is consistently greater than 150/90, please make an office appointment.  Return in 6 months for follow-up   

## 2014-09-11 NOTE — Progress Notes (Signed)
Subjective:    Patient ID: Cody Woodard, male    DOB: [birth year], 49 y.o.   MRN: [id number]  HPI  49 year old patient who has treated hypertension and a history of gouty arthritis.  He has been very well-controlled on Uloric , but now his insurance plan has denied coverage for this drug.  This medication has been very well tolerated and effective and he does not wish to consider alternate therapy.  He is requesting a letter to appeal this non-coverage for uloric. Otherwise, doing quite well.  He was seen in June of last year for his annual exam  Past Medical History  Diagnosis Date  . ALLERGIC RHINITIS 11/08/2007  . CHRONIC GOUTY ARTHROPATHY W/O MENTION TOPHUS 02/26/2010  . Gouty arthropathy 01/15/2008  . HYPERLIPIDEMIA 11/08/2007  . HYPERTENSION, ESSENTIAL, UNCONTROLLED 07/20/2010  . PSORIASIS, SCALP 02/14/2008  . SCIATICA, CHRONIC 01/19/2009  . TENDINITIS, LEFT ELBOW 03/15/2010    History   Social History  . Marital Status: Married    Spouse Name: N/A    Number of Children: N/A  . Years of Education: N/A   Occupational History  . Not on file.   Social History Main Topics  . Smoking status: Former Smoker    Quit date: 08/09/2007  . Smokeless tobacco: Never Used  . Alcohol Use: Yes  . Drug Use: No  . Sexual Activity: Not on file   Other Topics Concern  . Not on file   Social History Narrative    Past Surgical History  Procedure Laterality Date  . Tendon repari    . Laceration repair    . Ear pinning      Family History  Problem Relation Age of Onset  . Adopted: Yes    No Known Allergies  Current Outpatient Prescriptions on File Prior to Visit  Medication Sig Dispense Refill  . bisoprolol-hydrochlorothiazide (ZIAC) 5-6.25 MG per tablet TAKE ONE TABLET BY MOUTH ONE TIME DAILY 90 tablet 2  . COLCRYS 0.6 MG tablet TAKE ONE TABLET BY MOUTH DAILY AS NEEDED, THEN 2 TABS AT THE ONSET AND 1 TAB 1 HOUR LATER 30 tablet 6  . methylPREDNISolone (MEDROL, PAK,) 4 MG tablet  follow package directions 21 tablet 0  . mometasone (ELOCON) 0.1 % cream APPLY LIGHT COAT ON RASH DAILY 30 g 5  . naproxen sodium (ANAPROX) 220 MG tablet Take 220 mg by mouth as needed.      . simvastatin (ZOCOR) 40 MG tablet TAKE ONE TABLET BY MOUTH ONE TIME DAILY 90 tablet 2  . ULORIC 40 MG tablet Take one tablet by mouth one time daily 30 tablet 10   No current facility-administered medications on file prior to visit.    BP 140/90 mmHg  Pulse 77  Temp(Src) 98.6 F (37 C) (Oral)  Resp 20  Ht 6' (1.829 m)  Wt 206 lb (93.441 kg)  BMI 27.93 kg/m2  SpO2 97%     Review of Systems  Constitutional: Negative for fever, chills, appetite change and fatigue.  HENT: Negative for congestion, dental problem, ear pain, hearing loss, sore throat, tinnitus, trouble swallowing and voice change.   Eyes: Negative for pain, discharge and visual disturbance.  Respiratory: Negative for cough, chest tightness, wheezing and stridor.   Cardiovascular: Negative for chest pain, palpitations and leg swelling.  Gastrointestinal: Negative for nausea, vomiting, abdominal pain, diarrhea, constipation, blood in stool and abdominal distention.  Genitourinary: Negative for urgency, hematuria, flank pain, discharge, difficulty urinating and genital sores.  Musculoskeletal: Negative for myalgias, back  pain, joint swelling, arthralgias, gait problem and neck stiffness.  Skin: Negative for rash.  Neurological: Negative for dizziness, syncope, speech difficulty, weakness, numbness and headaches.  Hematological: Negative for adenopathy. Does not bruise/bleed easily.  Psychiatric/Behavioral: Negative for behavioral problems and dysphoric mood. The patient is not nervous/anxious.        Objective:   Physical Exam  Constitutional: He is oriented to person, place, and time. He appears well-developed.  Repeat blood pressure remains high normal at 140 over 90  HENT:  Head: Normocephalic.  Right Ear: External ear  normal.  Left Ear: External ear normal.  Eyes: Conjunctivae and EOM are normal.  Neck: Normal range of motion.  Cardiovascular: Normal rate and normal heart sounds.   Pulmonary/Chest: Breath sounds normal.  Abdominal: Bowel sounds are normal.  Musculoskeletal: Normal range of motion. He exhibits no edema or tenderness.  Neurological: He is alert and oriented to person, place, and time.  Psychiatric: He has a normal mood and affect. His behavior is normal.          Assessment & Plan:   Hypertension History of gout.  A letter on behalf of Cody Woodard will be dictated to appeal for noncoverage of uloric.  This medication has been very well tolerated and effective  CPX 6 months

## 2014-09-11 NOTE — Progress Notes (Signed)
Pre visit review using our clinic review tool, if applicable. No additional management support is needed unless otherwise documented below in the visit note. 

## 2014-09-17 ENCOUNTER — Telehealth: Payer: Self-pay | Admitting: Internal Medicine

## 2014-09-17 ENCOUNTER — Telehealth: Payer: Self-pay

## 2014-09-17 MED ORDER — COLCHICINE 0.6 MG PO TABS: 0.6000 mg | ORAL_TABLET | Freq: Every day | ORAL | Status: DC

## 2014-09-17 NOTE — Telephone Encounter (Signed)
PA for colchicine was denied.  Patient must try and fail Mitigare.

## 2014-09-17 NOTE — Telephone Encounter (Signed)
Yes metigare is brand name colchicine so that's fine. May send rx.

## 2014-09-17 NOTE — Telephone Encounter (Signed)
Is this switch ok?

## 2014-09-17 NOTE — Telephone Encounter (Signed)
Colchicine sent into [facility]

## 2014-09-17 NOTE — Telephone Encounter (Signed)
Is this ok Dr. Hunter? 

## 2014-09-17 NOTE — Telephone Encounter (Signed)
Fine to switch. Thanks!

## 2014-09-17 NOTE — Telephone Encounter (Signed)
[facility] refill request for COLCHICINE. Pt currently on COLCRYS but note from pharmacy says pt would like to switch to COLCHICINE

## 2014-09-18 MED ORDER — COLCHICINE 0.6 MG PO CAPS: 0.6000 mg | ORAL_CAPSULE | Freq: Every day | ORAL | Status: DC

## 2014-09-18 NOTE — Telephone Encounter (Signed)
Mitigare sent in to [facility]

## 2014-11-10 ENCOUNTER — Telehealth: Payer: Self-pay

## 2014-11-10 MED ORDER — FEBUXOSTAT 40 MG PO TABS: 40.0000 mg | ORAL_TABLET | Freq: Every day | ORAL | Status: DC

## 2014-11-10 NOTE — Telephone Encounter (Signed)
[facility] refill request for ULORIC 40MG  TAB

## 2014-11-11 ENCOUNTER — Other Ambulatory Visit: Payer: Self-pay | Admitting: *Deleted

## 2014-11-11 MED ORDER — FEBUXOSTAT 40 MG PO TABS: 40.0000 mg | ORAL_TABLET | Freq: Every day | ORAL | Status: DC

## 2015-01-21 ENCOUNTER — Encounter: Payer: Self-pay | Admitting: Family Medicine

## 2015-01-21 ENCOUNTER — Ambulatory Visit (INDEPENDENT_AMBULATORY_CARE_PROVIDER_SITE_OTHER): Payer: 59 | Admitting: Family Medicine

## 2015-01-21 VITALS — BP 142/84 | HR 68 | Temp 98.2°F | Wt 205.0 lb

## 2015-01-21 DIAGNOSIS — I10 Essential (primary) hypertension: Secondary | ICD-10-CM

## 2015-01-21 DIAGNOSIS — Z87891 Personal history of nicotine dependence: Secondary | ICD-10-CM

## 2015-01-21 DIAGNOSIS — M1A00X0 Idiopathic chronic gout, unspecified site, without tophus (tophi): Secondary | ICD-10-CM

## 2015-01-21 DIAGNOSIS — Z1211 Encounter for screening for malignant neoplasm of colon: Secondary | ICD-10-CM | POA: Diagnosis not present

## 2015-01-21 DIAGNOSIS — E785 Hyperlipidemia, unspecified: Secondary | ICD-10-CM | POA: Diagnosis not present

## 2015-01-21 DIAGNOSIS — L409 Psoriasis, unspecified: Secondary | ICD-10-CM

## 2015-01-21 LAB — LIPID PANEL
CHOL/HDL RATIO: 4
CHOLESTEROL: 239 mg/dL — AB (ref 0–200)
HDL: 64.8 mg/dL (ref >39.00)
LDL CALC: 156 mg/dL — AB (ref 0–99)
NonHDL: 174.2
TRIGLYCERIDES: 91 mg/dL (ref 0.0–149.0)
VLDL: 18.2 mg/dL (ref 0.0–40.0)

## 2015-01-21 LAB — URIC ACID: Uric Acid, Serum: 7.6 mg/dL (ref 4.0–7.8)

## 2015-01-21 LAB — CBC
HCT: 42.4 % (ref 39.0–52.0)
HEMOGLOBIN: 14.6 g/dL (ref 13.0–17.0)
MCHC: 34.4 g/dL (ref 30.0–36.0)
MCV: 96.5 fl (ref 78.0–100.0)
PLATELETS: 218 10*3/uL (ref 150.0–400.0)
RBC: 4.4 Mil/uL (ref 4.22–5.81)
RDW: 13.2 % (ref 11.5–15.5)
WBC: 5.7 10*3/uL (ref 4.0–10.5)

## 2015-01-21 LAB — COMPREHENSIVE METABOLIC PANEL
ALT: 42 U/L (ref 0–53)
AST: 33 U/L (ref 0–37)
Albumin: 4.8 g/dL (ref 3.5–5.2)
Alkaline Phosphatase: 45 U/L (ref 39–117)
BUN: 18 mg/dL (ref 6–23)
CALCIUM: 10 mg/dL (ref 8.4–10.5)
CO2: 29 meq/L (ref 19–32)
Chloride: 102 mEq/L (ref 96–112)
Creatinine, Ser: 0.95 mg/dL (ref 0.40–1.50)
GFR: 89.04 mL/min (ref >60.00)
GLUCOSE: 112 mg/dL — AB (ref 70–99)
Potassium: 4.3 mEq/L (ref 3.5–5.1)
SODIUM: 137 meq/L (ref 135–145)
TOTAL PROTEIN: 7.5 g/dL (ref 6.0–8.3)
Total Bilirubin: 0.6 mg/dL (ref 0.2–1.2)

## 2015-01-21 LAB — POCT URINALYSIS DIPSTICK
Bilirubin, UA: NEGATIVE
Blood, UA: NEGATIVE
Glucose, UA: NEGATIVE
KETONES UA: NEGATIVE
Leukocytes, UA: NEGATIVE
Nitrite, UA: NEGATIVE
PROTEIN UA: NEGATIVE
SPEC GRAV UA: 1.02
Urobilinogen, UA: 0.2
pH, UA: 6.5

## 2015-01-21 MED ORDER — MOMETASONE FUROATE 0.1 % EX CREA: TOPICAL_CREAM | CUTANEOUS | Status: DC

## 2015-01-21 MED ORDER — COLCHICINE 0.6 MG PO CAPS: ORAL_CAPSULE | ORAL | Status: DC

## 2015-01-21 NOTE — Progress Notes (Signed)
Cody Reddish, MD Phone: [phone]  Subjective:  Patient presents today to establish care with me as their new primary care provider. Patient was formerly a patient of Dr. Arnoldo Morale. Chief complaint-noted.   See problem oriented charting ROS- no chest pain or shortness of breath. No myalgias. No hot/swollen joints. No new or persistent joint pain.   The following were reviewed and entered/updated in epic: Past Medical History  Diagnosis Date  . ALLERGIC RHINITIS 11/08/2007  . Gouty arthropathy 01/15/2008  . HYPERLIPIDEMIA 11/08/2007  . HYPERTENSION, ESSENTIAL, UNCONTROLLED 07/20/2010  . PSORIASIS, SCALP 02/14/2008  . SCIATICA, CHRONIC 01/19/2009  . TENDINITIS, LEFT ELBOW 03/15/2010  . Olecranon bursitis of left elbow 02/16/2011    Improved now- seen Dr. Oneida Alar in the past.  Dr. Claudia Desanctis is chronic and has been there for years. It rarely flares or causes him problems. He thinks this was traumatic and not related to his gout    Patient Active Problem List   Diagnosis Date Noted  . Essential hypertension 07/20/2010    Priority: Medium  . Chronic gouty arthropathy 02/26/2010    Priority: Medium  . Hyperlipidemia 11/08/2007    Priority: Medium  . Former smoker 01/21/2015    Priority: Low  . Back pain with radiation 01/19/2009    Priority: Low  . Psoriasis 02/14/2008    Priority: Low   Past Surgical History  Procedure Laterality Date  . Tendon repari      through window age 69  . Laceration repair      through window age 52  . Ear pinning      Family History  Problem Relation Age of Onset  . Adopted: Yes  . Other Mother     unknown- adopted    Medications- reviewed and updated   Allergies-reviewed and updated No Known Allergies  History   Social History  . Marital Status: Married    Spouse Name: N/A  . Number of Children: N/A  . Years of Education: N/A   Social History Main Topics  . Smoking status: Former Smoker -- 1.00 packs/day for 23 years    Types:  Cigarettes    Quit date: 08/09/2007  . Smokeless tobacco: Never Used  . Alcohol Use: 16.8 oz/week    28 Standard drinks or equivalent per week     Comment: wine  . Drug Use: No  . Sexual Activity: Not on file   Other Topics Concern  . None   Social History Narrative   Married ( wife patient here as well). Son in [place] 28 in 2016. 1 granddaughter ( 2 in 2016)      [place]: golfing, music, time with family/friends-enjoying food    ROS--See HPI   Objective: BP 142/84 mmHg  Pulse 68  Temp(Src) 98.2 F (36.8 C)  Wt 205 lb (92.987 kg) Gen: NAD, resting comfortably HEENT: Mucous membranes are moist. Oropharynx normal CV: RRR no murmurs rubs or gallops Lungs: CTAB no crackles, wheeze, rhonchi Abdomen: soft/nontender/nondistended/normal bowel sounds. No rebound or guarding.  Ext: no edema Skin: warm, dry, no rash  Neuro: grossly normal, moves all extremities, PERRLA   Assessment/Plan:  Hyperlipidemia S: mild poor control on simvastatin 40mg  in past A/P: discussed importance of diet/exercise. LIkely would not increase statin unless LDL >130 though target goal still <100.    Essential hypertension S: suspected controlled on ziac- mild elevation today as did not take med this morning as planned A/P: discussed HCTZ could contribute to gout but  uric acid levels have looked excellent .Given BP has been controlled on this, will not change. Discussed home monitoring and stressed daily adherence to medication.    Chronic gouty arthropathy S:uloric 40mg , colchicine prn. Uric acid 3.9 01/2014. Twinges about 4 a year. No full attacks A/P: continue current rx- check uric acid   Psoriasis S: scalp and legs mainly. Controlled with sun or mometasone prn A/P: refill mometasone for prn use    Fasting labs today Refer for 1st colonoscopy Orders Placed This Encounter  Procedures  . CBC    [place]  . Comprehensive metabolic panel    [place]    Order  Specific Question:  Has the patient fasted?    Answer:  No  . Lipid panel    [place]    Order Specific Question:  Has the patient fasted?    Answer:  No  . Uric Acid  . Ambulatory referral to Gastroenterology    Referral Priority:  Routine    Referral Type:  Consultation    Referral Reason:  Specialty Services Required    Number of Visits Requested:  1  . POCT urinalysis dipstick    In house    Meds ordered this encounter  Medications  . mometasone (ELOCON) 0.1 % cream    Sig: APPLY LIGHT COAT ON RASH DAILY Hold for a week after 14 days use    Dispense:  30 g    Refill:  5  . Colchicine (MITIGARE) 0.6 MG CAPS    Sig: Take 2 pills at first sign of gout flare, then take 1 pill 2 hours later if pain persists. 1 pill daily thereafter until flare resolves    Dispense:  30 capsule    Refill:  5

## 2015-01-21 NOTE — Assessment & Plan Note (Signed)
S:uloric 40mg , colchicine prn. Uric acid 3.9 01/2014. Twinges about 4 a year. No full attacks A/P: continue current rx- check uric acid

## 2015-01-21 NOTE — Patient Instructions (Addendum)
[facility] will call you to schedule colonoscopy. If you do not hear within 2 weeks, give [place] a call.   Take BP medicine daily. Check at a pharmacy once a month and if over 140/90 come see me sooner.  Suggest cutting wine down to maximum 2 glasses of wine a night on average Read over handouts about prostate cancer screening- we will decide next year what we want to do Fasting labs today Refilled mometasone for psoriasis

## 2015-01-21 NOTE — Assessment & Plan Note (Signed)
S: suspected controlled on ziac- mild elevation today as did not take med this morning as planned A/P: discussed HCTZ could contribute to gout but uric acid levels have looked excellent .Given BP has been controlled on this, will not change. Discussed home monitoring and stressed daily adherence to medication.

## 2015-01-21 NOTE — Assessment & Plan Note (Signed)
S: mild poor control on simvastatin 40mg  in past A/P: discussed importance of diet/exercise. LIkely would not increase statin unless LDL >130 though target goal still <100.

## 2015-01-21 NOTE — Assessment & Plan Note (Signed)
S: scalp and legs mainly. Controlled with sun or mometasone prn A/P: refill mometasone for prn use

## 2015-01-22 ENCOUNTER — Encounter: Payer: Self-pay | Admitting: Gastroenterology

## 2015-02-18 ENCOUNTER — Other Ambulatory Visit: Payer: Self-pay | Admitting: *Deleted

## 2015-02-18 MED ORDER — SIMVASTATIN 40 MG PO TABS: ORAL_TABLET | ORAL | Status: DC

## 2015-03-12 ENCOUNTER — Ambulatory Visit (AMBULATORY_SURGERY_CENTER): Payer: Self-pay | Admitting: *Deleted

## 2015-03-12 VITALS — Ht 72.0 in | Wt 202.4 lb

## 2015-03-12 DIAGNOSIS — Z1211 Encounter for screening for malignant neoplasm of colon: Secondary | ICD-10-CM

## 2015-03-12 MED ORDER — NA SULFATE-K SULFATE-MG SULF 17.5-3.13-1.6 GM/177ML PO SOLN
ORAL | Status: DC
Start: 2015-03-12 — End: 2015-03-26

## 2015-03-12 NOTE — Progress Notes (Signed)
No egg or soy allergy  No anesthesia or intubation problems per pt  No diet medications taken  Registered in EMMI   

## 2015-03-26 ENCOUNTER — Encounter: Payer: Self-pay | Admitting: Gastroenterology

## 2015-03-26 ENCOUNTER — Ambulatory Visit (AMBULATORY_SURGERY_CENTER): Payer: 59 | Admitting: Gastroenterology

## 2015-03-26 VITALS — BP 121/70 | HR 48 | Temp 96.6°F | Resp 16 | Ht 72.0 in | Wt 202.0 lb

## 2015-03-26 DIAGNOSIS — D123 Benign neoplasm of transverse colon: Secondary | ICD-10-CM | POA: Diagnosis not present

## 2015-03-26 DIAGNOSIS — Z1211 Encounter for screening for malignant neoplasm of colon: Secondary | ICD-10-CM | POA: Diagnosis not present

## 2015-03-26 MED ORDER — SODIUM CHLORIDE 0.9 % IV SOLN: 500.0000 mL | INTRAVENOUS | Status: DC

## 2015-03-26 NOTE — Op Note (Signed)
[place]  [address] [place], [id number]   COLONOSCOPY PROCEDURE REPORT  PATIENT: Cody Woodard, Cody Woodard  MR#: [id number] BIRTHDATE: May 16, 1966 , 49  yrs. old GENDER: male ENDOSCOPIST: Ladene Artist, MD, [facility] REFERRED XB:JYNWGN, Brayton Mars MD PROCEDURE DATE:  03/26/2015 PROCEDURE:   Colonoscopy, screening and Colonoscopy with biopsy First Screening Colonoscopy - Avg.  risk and is 50 yrs.  old or older Yes.  Prior Negative Screening - Now for repeat screening. N/A  History of Adenoma - Now for follow-up colonoscopy & has been > or = to 3 yrs.  N/A  Polyps removed today? Yes ASA CLASS:   Class II INDICATIONS:Screening for colonic neoplasia and Colorectal Neoplasm Risk Assessment for this procedure is average risk. MEDICATIONS: Monitored anesthesia care and Propofol 300 mg IV DESCRIPTION OF PROCEDURE:   After the risks benefits and alternatives of the procedure were thoroughly explained, informed consent was obtained.  The digital rectal exam revealed no abnormalities of the rectum.   The LB [id number]  endoscope was introduced through the anus and advanced to the cecum, which was identified by both the appendix and ileocecal valve. No adverse events experienced.   The quality of the prep was good.  (Suprep was used)  The instrument was then slowly withdrawn as the colon was fully examined. Estimated blood loss is zero unless otherwise noted in this procedure report.    COLON FINDINGS: Two sessile polyps measuring 5 mm in size were found in the transverse colon.  Polypectomies were performed with cold forceps.  The resection was complete, the polyp tissue was completely retrieved and sent to histology.   The examination was otherwise normal.  Retroflexed views revealed internal Grade I hemorrhoids. The time to cecum = 1.5 Withdrawal time = 12.0   The scope was withdrawn and the procedure completed. COMPLICATIONS: There were no immediate  complications.  ENDOSCOPIC IMPRESSION: 1.   Two sessile polyps in the transverse colon; polypectomies performed with cold forceps 2.   Grade l internal hemorrhoids  RECOMMENDATIONS: 1.  Await pathology results 2.  Repeat colonoscopy in 5 years if polyp(s) adenomatous; otherwise 10 years  eSigned:  Ladene Artist, MD, [facility] 03/26/2015 10:51 AM

## 2015-03-26 NOTE — Progress Notes (Signed)
Report to PACU, RN, vss, BBS= Clear.  

## 2015-03-26 NOTE — Patient Instructions (Signed)
YOU HAD AN ENDOSCOPIC PROCEDURE TODAY AT [facility]:   Refer to the procedure report that was given to you for any specific questions about what was found during the examination.  If the procedure report does not answer your questions, please call your gastroenterologist to clarify.  If you requested that your care partner not be given the details of your procedure findings, then the procedure report has been included in a sealed envelope for you to review at your convenience later.  YOU SHOULD EXPECT: Some feelings of bloating in the abdomen. Passage of more gas than usual.  Walking can help get rid of the air that was put into your GI tract during the procedure and reduce the bloating. If you had a lower endoscopy (such as a colonoscopy or flexible sigmoidoscopy) you may notice spotting of blood in your stool or on the toilet paper. If you underwent a bowel prep for your procedure, you may not have a normal bowel movement for a few days.  Please Note:  You might notice some irritation and congestion in your nose or some drainage.  This is from the oxygen used during your procedure.  There is no need for concern and it should clear up in a day or so.  SYMPTOMS TO REPORT IMMEDIATELY:   Following lower endoscopy (colonoscopy or flexible sigmoidoscopy):  Excessive amounts of blood in the stool  Significant tenderness or worsening of abdominal pains  Swelling of the abdomen that is new, acute  Fever of 100F or higher   For urgent or emergent issues, a gastroenterologist can be reached at any hour by calling [phone].   DIET: Your first meal following the procedure should be a small meal and then it is ok to progress to your normal diet. Heavy or fried foods are harder to digest and may make you feel nauseous or bloated.  Likewise, meals heavy in dairy and vegetables can increase bloating.  Drink plenty of fluids but you should avoid alcoholic beverages for 24  hours.  ACTIVITY:  You should plan to take it easy for the rest of today and you should NOT DRIVE or use heavy machinery until tomorrow (because of the sedation medicines used during the test).    FOLLOW UP: Our staff will call the number listed on your records the next business day following your procedure to check on you and address any questions or concerns that you may have regarding the information given to you following your procedure. If we do not reach you, we will leave a message.  However, if you are feeling well and you are not experiencing any problems, there is no need to return our call.  We will assume that you have returned to your regular daily activities without incident.  If any biopsies were taken you will be contacted by phone or by letter within the next 1-3 weeks.  Please call us at [phone] if you have not heard about the biopsies in 3 weeks.    SIGNATURES/CONFIDENTIALITY: You and/or your care partner have signed paperwork which will be entered into your electronic medical record.  These signatures attest to the fact that that the information above on your After Visit Summary has been reviewed and is understood.  Full responsibility of the confidentiality of this discharge information lies with you and/or your care-partner.  Continue your normal medications  Please read over handouts about polyps, hemorrhoids and high fiber diets

## 2015-03-26 NOTE — Progress Notes (Signed)
Called to room to assist during endoscopic procedure.  Patient ID and intended procedure confirmed with present staff. Received instructions for my participation in the procedure from the performing physician.  

## 2015-03-27 ENCOUNTER — Telehealth: Payer: Self-pay | Admitting: *Deleted

## 2015-03-27 NOTE — Telephone Encounter (Signed)
  Follow up Call-  Call back number 03/26/2015  Post procedure Call Back phone  # [phone]  Permission to leave phone message Yes     Patient questions:  Do you have a fever, pain , or abdominal swelling? No. Pain Score  0 *  Have you tolerated food without any problems? Yes.    Have you been able to return to your normal activities? Yes.    Do you have any questions about your discharge instructions: Diet   No. Medications  No. Follow up visit  No.  Do you have questions or concerns about your Care? No.  Actions: * If pain score is 4 or above: No action needed, pain <4.

## 2015-03-31 ENCOUNTER — Other Ambulatory Visit: Payer: Self-pay | Admitting: *Deleted

## 2015-03-31 MED ORDER — BISOPROLOL-HYDROCHLOROTHIAZIDE 5-6.25 MG PO TABS: ORAL_TABLET | ORAL | Status: DC

## 2015-04-02 ENCOUNTER — Encounter: Payer: Self-pay | Admitting: Gastroenterology

## 2015-04-03 ENCOUNTER — Encounter: Payer: Self-pay | Admitting: Family Medicine

## 2015-04-03 DIAGNOSIS — D126 Benign neoplasm of colon, unspecified: Secondary | ICD-10-CM | POA: Insufficient documentation

## 2015-10-16 ENCOUNTER — Other Ambulatory Visit: Payer: Self-pay | Admitting: Family Medicine

## 2016-01-07 ENCOUNTER — Other Ambulatory Visit: Payer: Self-pay | Admitting: Family Medicine

## 2016-01-15 ENCOUNTER — Other Ambulatory Visit: Payer: 59

## 2016-01-22 ENCOUNTER — Encounter: Payer: 59 | Admitting: Family Medicine

## 2016-03-09 ENCOUNTER — Other Ambulatory Visit: Payer: Self-pay | Admitting: Family Medicine

## 2016-04-21 ENCOUNTER — Other Ambulatory Visit (INDEPENDENT_AMBULATORY_CARE_PROVIDER_SITE_OTHER): Payer: 59

## 2016-04-21 DIAGNOSIS — Z0000 Encounter for general adult medical examination without abnormal findings: Secondary | ICD-10-CM

## 2016-04-21 LAB — LIPID PANEL
CHOL/HDL RATIO: 3
Cholesterol: 184 mg/dL (ref 0–200)
HDL: 52.9 mg/dL (ref >39.00)
LDL Cholesterol: 101 mg/dL — ABNORMAL HIGH (ref 0–99)
NONHDL: 130.98
TRIGLYCERIDES: 152 mg/dL — AB (ref 0.0–149.0)
VLDL: 30.4 mg/dL (ref 0.0–40.0)

## 2016-04-21 LAB — BASIC METABOLIC PANEL
BUN: 18 mg/dL (ref 6–23)
CALCIUM: 9.6 mg/dL (ref 8.4–10.5)
CHLORIDE: 103 meq/L (ref 96–112)
CO2: 29 meq/L (ref 19–32)
CREATININE: 0.88 mg/dL (ref 0.40–1.50)
GFR: 96.78 mL/min (ref >60.00)
GLUCOSE: 96 mg/dL (ref 70–99)
Potassium: 4.2 mEq/L (ref 3.5–5.1)
Sodium: 140 mEq/L (ref 135–145)

## 2016-04-21 LAB — POC URINALSYSI DIPSTICK (AUTOMATED)
BILIRUBIN UA: NEGATIVE
Blood, UA: NEGATIVE
Glucose, UA: NEGATIVE
KETONES UA: NEGATIVE
LEUKOCYTES UA: NEGATIVE
Nitrite, UA: NEGATIVE
Protein, UA: NEGATIVE
SPEC GRAV UA: 1.025
Urobilinogen, UA: 0.2
pH, UA: 6.5

## 2016-04-21 LAB — HEPATIC FUNCTION PANEL
ALBUMIN: 4.5 g/dL (ref 3.5–5.2)
ALK PHOS: 49 U/L (ref 39–117)
ALT: 28 U/L (ref 0–53)
AST: 23 U/L (ref 0–37)
BILIRUBIN DIRECT: 0.1 mg/dL (ref 0.0–0.3)
TOTAL PROTEIN: 6.9 g/dL (ref 6.0–8.3)
Total Bilirubin: 0.5 mg/dL (ref 0.2–1.2)

## 2016-04-21 LAB — CBC WITH DIFFERENTIAL/PLATELET
BASOS ABS: 0 10*3/uL (ref 0.0–0.1)
BASOS PCT: 0.4 % (ref 0.0–3.0)
EOS ABS: 0.1 10*3/uL (ref 0.0–0.7)
Eosinophils Relative: 1.5 % (ref 0.0–5.0)
HEMATOCRIT: 40.5 % (ref 39.0–52.0)
Hemoglobin: 14.2 g/dL (ref 13.0–17.0)
LYMPHS ABS: 1.9 10*3/uL (ref 0.7–4.0)
Lymphocytes Relative: 37.9 % (ref 12.0–46.0)
MCHC: 35.1 g/dL (ref 30.0–36.0)
MCV: 95.1 fl (ref 78.0–100.0)
MONO ABS: 0.5 10*3/uL (ref 0.1–1.0)
Monocytes Relative: 10.4 % (ref 3.0–12.0)
NEUTROS ABS: 2.5 10*3/uL (ref 1.4–7.7)
NEUTROS PCT: 49.8 % (ref 43.0–77.0)
PLATELETS: 199 10*3/uL (ref 150.0–400.0)
RBC: 4.26 Mil/uL (ref 4.22–5.81)
RDW: 12.8 % (ref 11.5–15.5)
WBC: 5.1 10*3/uL (ref 4.0–10.5)

## 2016-04-21 LAB — PSA: PSA: 1.13 ng/mL (ref 0.10–4.00)

## 2016-04-21 LAB — TSH: TSH: 0.71 u[IU]/mL (ref 0.35–4.50)

## 2016-04-28 ENCOUNTER — Encounter: Payer: Self-pay | Admitting: Family Medicine

## 2016-04-28 ENCOUNTER — Ambulatory Visit (INDEPENDENT_AMBULATORY_CARE_PROVIDER_SITE_OTHER): Payer: 59 | Admitting: Family Medicine

## 2016-04-28 VITALS — BP 130/84 | HR 67 | Temp 98.1°F | Ht 72.0 in | Wt 188.1 lb

## 2016-04-28 DIAGNOSIS — Z23 Encounter for immunization: Secondary | ICD-10-CM | POA: Diagnosis not present

## 2016-04-28 DIAGNOSIS — Z0000 Encounter for general adult medical examination without abnormal findings: Secondary | ICD-10-CM

## 2016-04-28 MED ORDER — BISOPROLOL-HYDROCHLOROTHIAZIDE 5-6.25 MG PO TABS: ORAL_TABLET | ORAL | 11 refills | Status: DC

## 2016-04-28 MED ORDER — FEBUXOSTAT 40 MG PO TABS: 40.0000 mg | ORAL_TABLET | Freq: Every day | ORAL | 11 refills | Status: DC

## 2016-04-28 MED ORDER — COLCHICINE 0.6 MG PO CAPS: ORAL_CAPSULE | ORAL | 5 refills | Status: DC

## 2016-04-28 MED ORDER — SIMVASTATIN 40 MG PO TABS: ORAL_TABLET | ORAL | 11 refills | Status: DC

## 2016-04-28 NOTE — Progress Notes (Signed)
Pre visit review using our clinic review tool, if applicable. No additional management support is needed unless otherwise documented below in the visit note. 

## 2016-04-28 NOTE — Progress Notes (Signed)
Phone: [phone]  Subjective:  Patient presents today for their annual physical. Chief complaint-noted.   See problem oriented charting- ROS- full  review of systems was completed and negative including No chest pain or shortness of breath. No headache or blurry vision. Does have psoriatic rash  The following were reviewed and entered/updated in epic: Past Medical History:  Diagnosis Date  . ALLERGIC RHINITIS 11/08/2007  . Blood transfusion without reported diagnosis   . Gouty arthropathy 01/15/2008  . HYPERLIPIDEMIA 11/08/2007  . HYPERTENSION, ESSENTIAL, UNCONTROLLED 07/20/2010  . Olecranon bursitis of left elbow 02/16/2011   Improved now- seen Dr. Oneida Alar in the past.  Dr. Claudia Desanctis is chronic and has been there for years. It rarely flares or causes him problems. He thinks this was traumatic and not related to his gout   . PSORIASIS, SCALP 02/14/2008  . SCIATICA, CHRONIC 01/19/2009  . TENDINITIS, LEFT ELBOW 03/15/2010   Patient Active Problem List   Diagnosis Date Noted  . Essential hypertension 07/20/2010    Priority: Medium  . Chronic gouty arthropathy 02/26/2010    Priority: Medium  . Hyperlipidemia 11/08/2007    Priority: Medium  . Former smoker 01/21/2015    Priority: Low  . Back pain with radiation 01/19/2009    Priority: Low  . Psoriasis 02/14/2008    Priority: Low  . Adenomatous colon polyp 04/03/2015   Past Surgical History:  Procedure Laterality Date  . ear pinning     at age 39  . LACERATION REPAIR     through window age 8  . tendon repari     through window age 40    Family History  Problem Relation Age of Onset  . Adopted: Yes  . Other Mother     unknown- adopted    Medications- reviewed and updated Current Outpatient Prescriptions  Medication Sig Dispense Refill  . betamethasone, augmented, (DIPROLENE) 0.05 % lotion     . bisoprolol-hydrochlorothiazide (ZIAC) 5-6.25 MG tablet TAKE ONE TABLET BY MOUTH ONE TIME DAILY. INSURANCE ALLOWS 30DAY 30  tablet 5  . clobetasol (TEMOVATE) 0.05 % external solution     . Colchicine (MITIGARE) 0.6 MG CAPS Take 2 pills at first sign of gout flare, then take 1 pill 2 hours later if pain persists. 1 pill daily thereafter until flare resolves 30 capsule 5  . hydrocortisone 2.5 % cream     . naproxen sodium (ANAPROX) 220 MG tablet Take 220 mg by mouth as needed.      . simvastatin (ZOCOR) 40 MG tablet TAKE ONE TABLET BY MOUTH ONE TIME DAILY 90 tablet 2  . triamcinolone cream (KENALOG) 0.1 %     . ULORIC 40 MG tablet TAKE ONE TABLET BY MOUTH ONE TIME DAILY 30 tablet 10   No current facility-administered medications for this visit.     Allergies-reviewed and updated No Known Allergies  Social History   Social History  . Marital status: Married    Spouse name: N/A  . Number of children: N/A  . Years of education: N/A   Social History Main Topics  . Smoking status: Former Smoker    Packs/day: 1.00    Years: 23.00    Types: Cigarettes    Quit date: 08/09/2007  . Smokeless tobacco: Never Used  . Alcohol use 29.4 oz/week    28 Standard drinks or equivalent, 21 Glasses of wine per week     Comment: wine  . Drug use: No  . Sexual activity: Not Asked   Other Topics Concern  .  None   Social History Narrative   Married ( wife patient here as well). Son in [place] 28 in 2016. 1 granddaughter ( 2 in 2016)      [place]: golfing, music, time with family/friends-enjoying food    Objective: BP 130/84 (BP Location: Left Arm, Patient Position: Sitting, Cuff Size: Normal)   Pulse 67   Temp 98.1 F (36.7 C) (Oral)   Ht 6' (1.829 m)   Wt 188 lb 1 oz (85.3 kg)   SpO2 97%   BMI 25.51 kg/m  Gen: NAD, resting comfortably HEENT: Mucous membranes are moist. Oropharynx normal Neck: no thyromegaly CV: RRR no murmurs rubs or gallops Lungs: CTAB no crackles, wheeze, rhonchi Abdomen: soft/nontender/nondistended/normal bowel sounds. No rebound or guarding.  Ext: no edema Skin:  warm, dry Neuro: grossly normal, moves all extremities, PERRLA Rectal: normal tone, normal sized prostate, no masses or tenderness   Assessment/Plan:  50 y.o. male presenting for annual physical.  Health Maintenance counseling: 1. Anticipatory guidance: Patient counseled regarding regular dental exams, eye exams, wearing seatbelts.  2. Risk factor reduction:  Advised patient of need for regular exercise and diet rich and fruits and vegetables to reduce risk of heart attack and stroke. Down 14 lbs 3. Immunizations/screenings/ancillary studies Immunization History  Administered Date(s) Administered  . Influenza Split 06/13/2012  . Influenza,inj,Quad PF,36+ Mos 04/28/2016  . Td 01/06/2001  . Tdap 03/11/2011    4. Prostate cancer screening- mild increase over last 5 years- low risk trend. Low risk rectal exam as well.  Lab Results  Component Value Date   PSA 1.13 04/21/2016   PSA 0.77 03/04/2011   5. Colon cancer screening - 03/2015 with 5 year repeat 6. Skin cancer screening- sees dermatology Dr. Ronnald Ramp yearly or as needed  Status of chronic or acute concerns  HLD- on simvastatin 40mg  controlled  BP controlled on ziac 5-6.25mg   Gout- controlled on uloric 40mg . Colchicine prn. Uric aid last 3.9. Twinges 4x a year previously but none in last year. discussed hctz portion- he wants to remain on.  Never on allopurinol  Psoriasis- on scalp controlled with prn steroids from dermatology- currently some active areas on bilateral shins. Scalp seems to be doing better  Lost 14 lbs- lipids far better LDL 101 and hyperglycemia resolved   Orders Placed This Encounter  Procedures  . Flu Vaccine QUAD 36+ mos IM    Meds ordered this encounter  Medications  . betamethasone, augmented, (DIPROLENE) 0.05 % lotion  . DISCONTD: clobetasol (TEMOVATE) 0.05 % external solution  . hydrocortisone 2.5 % cream  . DISCONTD: triamcinolone cream (KENALOG) 0.1 %  . bisoprolol-hydrochlorothiazide (ZIAC)  5-6.25 MG tablet    Sig: TAKE ONE TABLET BY MOUTH ONE TIME DAILY INSURANCE ALLOWS 30DAY    Dispense:  31 tablet    Refill:  11  . Colchicine (MITIGARE) 0.6 MG CAPS    Sig: Take 2 pills at first sign of gout flare, then take 1 pill 2 hours later if pain persists. 1 pill daily thereafter until flare resolves    Dispense:  30 capsule    Refill:  5  . simvastatin (ZOCOR) 40 MG tablet    Sig: TAKE ONE TABLET BY MOUTH ONE TIME DAILY. INS ALLOWS 30 DAYS SUPPLY    Dispense:  31 tablet    Refill:  11  . febuxostat (ULORIC) 40 MG tablet    Sig: Take 1 tablet (40 mg total) by mouth daily.    Dispense:  31 tablet    Refill:  11    Return precautions advised.   Garret Reddish, MD

## 2016-04-28 NOTE — Patient Instructions (Signed)
Flu shot given  [place] job with weight loss! All of your #s look better  Follow up 1 year  Refilled meds 1 year Meds ordered this encounter  . bisoprolol-hydrochlorothiazide (ZIAC) 5-6.25 MG tablet    Sig: TAKE ONE TABLET BY MOUTH ONE TIME DAILY INSURANCE ALLOWS 30DAY    Dispense:  31 tablet    Refill:  11  . Colchicine (MITIGARE) 0.6 MG CAPS    Sig: Take 2 pills at first sign of gout flare, then take 1 pill 2 hours later if pain persists. 1 pill daily thereafter until flare resolves    Dispense:  30 capsule    Refill:  5  . simvastatin (ZOCOR) 40 MG tablet    Sig: TAKE ONE TABLET BY MOUTH ONE TIME DAILY. INS ALLOWS 30 DAYS SUPPLY    Dispense:  31 tablet    Refill:  11  . febuxostat (ULORIC) 40 MG tablet    Sig: Take 1 tablet (40 mg total) by mouth daily.    Dispense:  31 tablet    Refill:  11

## 2016-11-03 ENCOUNTER — Other Ambulatory Visit: Payer: Self-pay | Admitting: Family Medicine

## 2016-11-04 ENCOUNTER — Other Ambulatory Visit: Payer: Self-pay | Admitting: Family Medicine

## 2016-12-10 ENCOUNTER — Other Ambulatory Visit: Payer: Self-pay | Admitting: Family Medicine

## 2016-12-16 ENCOUNTER — Other Ambulatory Visit: Payer: Self-pay | Admitting: Family Medicine

## 2017-05-11 ENCOUNTER — Other Ambulatory Visit: Payer: Self-pay | Admitting: Family Medicine

## 2017-05-12 ENCOUNTER — Other Ambulatory Visit: Payer: Self-pay | Admitting: Family Medicine

## 2017-07-10 ENCOUNTER — Encounter: Payer: Self-pay | Admitting: Family Medicine

## 2017-07-10 ENCOUNTER — Ambulatory Visit (INDEPENDENT_AMBULATORY_CARE_PROVIDER_SITE_OTHER): Payer: 59 | Admitting: Family Medicine

## 2017-07-10 VITALS — BP 134/88 | HR 64 | Temp 98.1°F | Ht 72.0 in | Wt 202.2 lb

## 2017-07-10 DIAGNOSIS — M1A00X0 Idiopathic chronic gout, unspecified site, without tophus (tophi): Secondary | ICD-10-CM

## 2017-07-10 DIAGNOSIS — Z23 Encounter for immunization: Secondary | ICD-10-CM

## 2017-07-10 DIAGNOSIS — I10 Essential (primary) hypertension: Secondary | ICD-10-CM

## 2017-07-10 DIAGNOSIS — E785 Hyperlipidemia, unspecified: Secondary | ICD-10-CM

## 2017-07-10 MED ORDER — SIMVASTATIN 40 MG PO TABS: ORAL_TABLET | ORAL | 11 refills | Status: DC

## 2017-07-10 MED ORDER — COLCHICINE 0.6 MG PO CAPS: ORAL_CAPSULE | ORAL | 5 refills | Status: AC

## 2017-07-10 MED ORDER — BISOPROLOL-HYDROCHLOROTHIAZIDE 5-6.25 MG PO TABS: ORAL_TABLET | ORAL | 11 refills | Status: DC

## 2017-07-10 MED ORDER — FEBUXOSTAT 40 MG PO TABS: 40.0000 mg | ORAL_TABLET | Freq: Every day | ORAL | 11 refills | Status: DC

## 2017-07-10 NOTE — Assessment & Plan Note (Signed)
S: controlled on ziac 5-6.25mg  in past and on repeat today   BP Readings from Last 3 Encounters:  07/10/17 (!) 148/86-->134/88  04/28/16 130/84  03/26/15 121/70  A/P: We discussed blood pressure goal of <140/90. Continue current meds

## 2017-07-10 NOTE — Assessment & Plan Note (Signed)
S:  patient continues his uloric 40mg . Has colchicine for prn use- perhaps one dose in last year.  A/P: doing well- continue uloric

## 2017-07-10 NOTE — Patient Instructions (Addendum)
Refilled medications  Works on getting those extra 10-15 lbs back off  Come fasting for morning physical in 6 months- schedule this before you leave

## 2017-07-10 NOTE — Assessment & Plan Note (Signed)
S: previously reasonably controlled on simvastatin though would want LDL <100. No myalgias.  Lab Results  Component Value Date   CHOL 184 04/21/2016   HDL 52.90 04/21/2016   LDLCALC 101 (H) 04/21/2016   LDLDIRECT 115.5 07/11/2011   TRIG 152.0 (H) 04/21/2016   CHOLHDL 3 04/21/2016   A/P: discussed working on weight loss, Had lost 14 lbs but has now regained - recheck lipids in 6 months after these efforts with LDL target under 100

## 2017-07-10 NOTE — Progress Notes (Signed)
Subjective:  Cody Woodard is a 51 y.o. year old very pleasant male patient who presents for/with See problem oriented charting ROS- No chest pain or shortness of breath. No headache or blurry vision.  No hot or swollen joints.    Past Medical History-  Patient Active Problem List   Diagnosis Date Noted  . Essential hypertension 07/20/2010    Priority: Medium  . Chronic gouty arthropathy 02/26/2010    Priority: Medium  . Hyperlipidemia 11/08/2007    Priority: Medium  . Former smoker 01/21/2015    Priority: Low  . Back pain with radiation 01/19/2009    Priority: Low  . Psoriasis 02/14/2008    Priority: Low  . Adenomatous colon polyp 04/03/2015   Medications- reviewed and updated Current Outpatient Medications  Medication Sig Dispense Refill  . betamethasone, augmented, (DIPROLENE) 0.05 % lotion     . bisoprolol-hydrochlorothiazide (ZIAC) 5-6.25 MG tablet TAKE ONE TABLET BY MOUTH ONE TIME DAILY INSURANCE ALLOWS 30DAY 30 tablet 0  . Colchicine (MITIGARE) 0.6 MG CAPS Take 2 pills at first sign of gout flare, then take 1 pill 2 hours later if pain persists. 1 pill daily thereafter until flare resolves 30 capsule 5  . hydrocortisone 2.5 % cream     . naproxen sodium (ANAPROX) 220 MG tablet Take 220 mg by mouth as needed.      . simvastatin (ZOCOR) 40 MG tablet TAKE ONE TABLET BY MOUTH ONE TIME DAILY. INS ALLOWS 30 DAYS SUPPLY 31 tablet 11  . ULORIC 40 MG tablet TAKE ONE TABLET BY MOUTH ONE TIME DAILY 30 tablet 10   Objective: BP 134/88   Pulse 64   Temp 98.1 F (36.7 C) (Oral)   Ht 6' (1.829 m)   Wt 202 lb 3.2 oz (91.7 kg)   SpO2 96%   BMI 27.42 kg/m  Gen: NAD, resting comfortably CV: RRR no murmurs rubs or gallops Lungs: CTAB no crackles, wheeze, rhonchi Abdomen: soft/nontender/nondistended/normal bowel sounds. overweight Ext: no edema Skin: warm, dry  Assessment/Plan:  Essential hypertension S: controlled on ziac 5-6.25mg  in past and on repeat today   BP Readings  from Last 3 Encounters:  07/10/17 (!) 148/86-->134/88  04/28/16 130/84  03/26/15 121/70  A/P: We discussed blood pressure goal of <140/90. Continue current meds  Chronic gouty arthropathy S:  patient continues his uloric 40mg . Has colchicine for prn use- perhaps one dose in last year.  A/P: doing well- continue uloric  Hyperlipidemia S: previously reasonably controlled on simvastatin though would want LDL <100. No myalgias.  Lab Results  Component Value Date   CHOL 184 04/21/2016   HDL 52.90 04/21/2016   LDLCALC 101 (H) 04/21/2016   LDLDIRECT 115.5 07/11/2011   TRIG 152.0 (H) 04/21/2016   CHOLHDL 3 04/21/2016   A/P: discussed working on weight loss, Had lost 14 lbs but has now regained - recheck lipids in 6 months after these efforts with LDL target under 100   Future Appointments  Date Time Provider [place]  01/11/2018  8:30 AM Marin Olp, MD [facility]   Return in about 6 months (around 01/08/2018) for physical.  Orders Placed This Encounter  Procedures  . Flu Vaccine QUAD 36+ mos IM   Meds ordered this encounter  Medications  . bisoprolol-hydrochlorothiazide (ZIAC) 5-6.25 MG tablet    Sig: TAKE ONE TABLET BY MOUTH ONE TIME DAILY    Dispense:  30 tablet    Refill:  11  . Colchicine (MITIGARE) 0.6 MG CAPS    Sig:  Take 2 pills at first sign of gout flare, then take 1 pill 2 hours later if pain persists. 1 pill daily thereafter until flare resolves    Dispense:  30 capsule    Refill:  5  . simvastatin (ZOCOR) 40 MG tablet    Sig: TAKE ONE TABLET BY MOUTH ONE TIME DAILY. INS ALLOWS 30 DAYS SUPPLY    Dispense:  30 tablet    Refill:  11  . febuxostat (ULORIC) 40 MG tablet    Sig: Take 1 tablet (40 mg total) by mouth daily.    Dispense:  30 tablet    Refill:  11   Return precautions advised.  Garret Reddish, MD

## 2018-01-11 ENCOUNTER — Encounter: Payer: 59 | Admitting: Family Medicine

## 2018-03-30 ENCOUNTER — Encounter: Payer: Self-pay | Admitting: Family Medicine

## 2018-03-30 ENCOUNTER — Ambulatory Visit (INDEPENDENT_AMBULATORY_CARE_PROVIDER_SITE_OTHER): Payer: 59 | Admitting: Family Medicine

## 2018-03-30 VITALS — BP 134/88 | HR 71 | Temp 98.6°F | Ht 72.0 in | Wt 198.0 lb

## 2018-03-30 DIAGNOSIS — Z0000 Encounter for general adult medical examination without abnormal findings: Secondary | ICD-10-CM

## 2018-03-30 DIAGNOSIS — M1812 Unilateral primary osteoarthritis of first carpometacarpal joint, left hand: Secondary | ICD-10-CM

## 2018-03-30 DIAGNOSIS — L409 Psoriasis, unspecified: Secondary | ICD-10-CM

## 2018-03-30 DIAGNOSIS — Z87891 Personal history of nicotine dependence: Secondary | ICD-10-CM | POA: Diagnosis not present

## 2018-03-30 DIAGNOSIS — E785 Hyperlipidemia, unspecified: Secondary | ICD-10-CM

## 2018-03-30 DIAGNOSIS — E8881 Metabolic syndrome: Secondary | ICD-10-CM | POA: Insufficient documentation

## 2018-03-30 DIAGNOSIS — M1A00X0 Idiopathic chronic gout, unspecified site, without tophus (tophi): Secondary | ICD-10-CM

## 2018-03-30 DIAGNOSIS — I10 Essential (primary) hypertension: Secondary | ICD-10-CM

## 2018-03-30 DIAGNOSIS — Z1331 Encounter for screening for depression: Secondary | ICD-10-CM | POA: Diagnosis not present

## 2018-03-30 DIAGNOSIS — Z125 Encounter for screening for malignant neoplasm of prostate: Secondary | ICD-10-CM

## 2018-03-30 DIAGNOSIS — Z23 Encounter for immunization: Secondary | ICD-10-CM | POA: Diagnosis not present

## 2018-03-30 DIAGNOSIS — E88819 Insulin resistance, unspecified: Secondary | ICD-10-CM | POA: Insufficient documentation

## 2018-03-30 LAB — POC URINALSYSI DIPSTICK (AUTOMATED)
Blood, UA: NEGATIVE
GLUCOSE UA: NEGATIVE
LEUKOCYTES UA: NEGATIVE
Nitrite, UA: NEGATIVE
Protein, UA: NEGATIVE
Spec Grav, UA: 1.02 (ref 1.010–1.025)
UROBILINOGEN UA: 1 U/dL
pH, UA: 6 (ref 5.0–8.0)

## 2018-03-30 LAB — COMPREHENSIVE METABOLIC PANEL
ALBUMIN: 4.7 g/dL (ref 3.5–5.2)
ALT: 36 U/L (ref 0–53)
AST: 23 U/L (ref 0–37)
Alkaline Phosphatase: 44 U/L (ref 39–117)
BUN: 23 mg/dL (ref 6–23)
CALCIUM: 10.1 mg/dL (ref 8.4–10.5)
CHLORIDE: 102 meq/L (ref 96–112)
CO2: 28 mEq/L (ref 19–32)
Creatinine, Ser: 0.96 mg/dL (ref 0.40–1.50)
GFR: 86.88 mL/min (ref >60.00)
Glucose, Bld: 104 mg/dL — ABNORMAL HIGH (ref 70–99)
POTASSIUM: 4.4 meq/L (ref 3.5–5.1)
SODIUM: 139 meq/L (ref 135–145)
Total Bilirubin: 0.8 mg/dL (ref 0.2–1.2)
Total Protein: 7.4 g/dL (ref 6.0–8.3)

## 2018-03-30 LAB — LIPID PANEL
CHOLESTEROL: 198 mg/dL (ref 0–200)
HDL: 55.2 mg/dL (ref >39.00)
LDL Cholesterol: 118 mg/dL — ABNORMAL HIGH (ref 0–99)
NonHDL: 143.27
TRIGLYCERIDES: 126 mg/dL (ref 0.0–149.0)
Total CHOL/HDL Ratio: 4
VLDL: 25.2 mg/dL (ref 0.0–40.0)

## 2018-03-30 LAB — CBC
HEMATOCRIT: 42 % (ref 39.0–52.0)
HEMOGLOBIN: 14.6 g/dL (ref 13.0–17.0)
MCHC: 34.9 g/dL (ref 30.0–36.0)
MCV: 95.2 fl (ref 78.0–100.0)
Platelets: 200 10*3/uL (ref 150.0–400.0)
RBC: 4.41 Mil/uL (ref 4.22–5.81)
RDW: 12.6 % (ref 11.5–15.5)
WBC: 4.7 10*3/uL (ref 4.0–10.5)

## 2018-03-30 LAB — URIC ACID: Uric Acid, Serum: 4.7 mg/dL (ref 4.0–7.8)

## 2018-03-30 LAB — PSA: PSA: 1.02 ng/mL (ref 0.10–4.00)

## 2018-03-30 NOTE — Progress Notes (Signed)
Phone: [phone]  Subjective:  Patient presents today for their annual physical. Chief complaint-noted.   See problem oriented charting- ROS- full  review of systems was completed and negative except for: rash from psoriasis  The following were reviewed and entered/updated in epic: Past Medical History:  Diagnosis Date  . ALLERGIC RHINITIS 11/08/2007  . Blood transfusion without reported diagnosis   . Gouty arthropathy 01/15/2008  . HYPERLIPIDEMIA 11/08/2007  . HYPERTENSION, ESSENTIAL, UNCONTROLLED 07/20/2010  . Olecranon bursitis of left elbow 02/16/2011   Improved now- seen Dr. Oneida Alar in the past.  Dr. Claudia Desanctis is chronic and has been there for years. It rarely flares or causes him problems. He thinks this was traumatic and not related to his gout   . PSORIASIS, SCALP 02/14/2008  . SCIATICA, CHRONIC 01/19/2009  . TENDINITIS, LEFT ELBOW 03/15/2010   Patient Active Problem List   Diagnosis Date Noted  . Essential hypertension 07/20/2010    Priority: Medium  . Chronic gouty arthropathy 02/26/2010    Priority: Medium  . Hyperlipidemia 11/08/2007    Priority: Medium  . Insulin resistance 03/30/2018    Priority: Low  . Adenomatous colon polyp 04/03/2015    Priority: Low  . Former smoker 01/21/2015    Priority: Low  . Back pain with radiation 01/19/2009    Priority: Low  . Psoriasis 02/14/2008    Priority: Low   Past Surgical History:  Procedure Laterality Date  . ear pinning     at age 9  . LACERATION REPAIR     through window age 31  . tendon repari     through window age 14    Family History  Adopted: Yes  Problem Relation Age of Onset  . Other Mother        unknown- adopted    Medications- reviewed and updated Current Outpatient Medications  Medication Sig Dispense Refill  . betamethasone, augmented, (DIPROLENE) 0.05 % lotion     . bisoprolol-hydrochlorothiazide (ZIAC) 5-6.25 MG tablet TAKE ONE TABLET BY MOUTH ONE TIME DAILY 30 tablet 11  . Colchicine  (MITIGARE) 0.6 MG CAPS Take 2 pills at first sign of gout flare, then take 1 pill 2 hours later if pain persists. 1 pill daily thereafter until flare resolves 30 capsule 5  . febuxostat (ULORIC) 40 MG tablet Take 1 tablet (40 mg total) by mouth daily. 30 tablet 11  . hydrocortisone 2.5 % cream     . naproxen sodium (ANAPROX) 220 MG tablet Take 220 mg by mouth as needed.      . simvastatin (ZOCOR) 40 MG tablet TAKE ONE TABLET BY MOUTH ONE TIME DAILY. INS ALLOWS 30 DAYS SUPPLY 30 tablet 11   No current facility-administered medications for this visit.     Allergies-reviewed and updated No Known Allergies  Social History   Social History Narrative   Married ( wife patient here as well). Son in [place] 28 in 2016. 1 granddaughter ( 2 in 2016)      [place]: golfing, music, time with family/friends-enjoying food    Objective: BP 134/88   Pulse 71   Temp 98.6 F (37 C) (Oral)   Ht 6' (1.829 m)   Wt 198 lb (89.8 kg)   BMI 26.85 kg/m  Gen: NAD, resting comfortably HEENT: Mucous membranes are moist. Oropharynx normal Neck: no thyromegaly CV: RRR no murmurs rubs or gallops Lungs: CTAB no crackles, wheeze, rhonchi Abdomen: soft/nontender/nondistended/normal bowel sounds. No rebound or guarding.  Ext: no edema  Skin: warm, dry Neuro: grossly normal, moves all extremities, PERRLA Rectal: normal tone, normal sized prostate, no masses or tenderness  Assessment/Plan:  52 y.o. male presenting for annual physical.  Health Maintenance counseling: 1. Anticipatory guidance: Patient counseled regarding regular dental exams -q6 months, eye exams -yearl or so- he is due, wearing seatbelts.  2. Risk factor reduction:  Advised patient of need for regular exercise and diet rich and fruits and vegetables to reduce risk of heart attack and stroke. Exercise- walking at least 8k steps a day. Diet- Up to 10 lbs from his lowest point of 188 but also down 4 lbs from last visit.  Encouraged to continue to work on this [employer] [facility] Readings from Last 3 Encounters:  03/30/18 198 lb (89.8 kg)  07/10/17 202 lb 3.2 oz (91.7 kg)  04/28/16 188 lb 1 oz (85.3 kg)  3. Immunizations/screenings/ancillary studies- discussed shingrix waiting list and opts for that. Discussed flu shot- opts for today.  Immunization History  Administered Date(s) Administered  . Influenza Split 06/13/2012  . Influenza,inj,Quad PF,6+ Mos 04/28/2016, 07/10/2017  . Td 01/06/2001  . Tdap 03/11/2011  4. Prostate cancer screening-  will trend PSA with labs. Low risk rectal exam. Nocturia once a night, some weaking of stream Lab Results  Component Value Date   PSA 1.13 04/21/2016   PSA 0.77 03/04/2011   5. Colon cancer screening - 03/2015 with 5 year repeat planned 6. Skin cancer screening- Dr. Ronnald Ramp yearly. advised regular sunscreen use. Denies worrisome, changing, or new skin lesions.  7. Former smoker- quit 2009 with 23 pack years. Get UA. AAA screen at 45  Status of chronic or acute concerns   Essential hypertension BP controlled on ziac 5-6.25mg  on repeat but high normal blood pressure. advied watch nsaids with BP risks  Chronic gouty arthropathy Gout- controlled on uloric 40mg - no flare ups. Colchicine prn. Uric acid under 6. discussed hctz portion- he wants to remain on.  Never on allopurinol  Hyperlipidemia HLD- on simvastatin 40mg . Update lipids  Insulin resistance Has had some hyperglycemia in past with CBG above 100 fasting- will update fasting CBG and if trending up consider a1c next visit   Psoriasis Psoriasis- on scalp controlled with prn steroids from dermatology- currently some active areas on bilateral shins. Scalp seems to be doing ok. He is going to try scalp cream on legs as it has been more effective. I also told him I would be willing to call in prednisone 20 mg for 7 days if needed if not making improvement.   Also arthritis in [facility] joint of left hand. Given psoriasis will refer to  rheumatology though we discussed that this joint is commonly OA as well- he is using naproxen with some relief. Will get rheumatology opinion. Discussed nsaids can raise BP.   Return in about 6 months (around 09/30/2018) for follow up- or sooner if needed.  Return precautions advised.  Garret Reddish, MD

## 2018-03-30 NOTE — Assessment & Plan Note (Signed)
Has had some hyperglycemia in past with CBG above 100 fasting- will update fasting CBG and if trending up consider a1c next visit

## 2018-03-30 NOTE — Assessment & Plan Note (Signed)
Gout- controlled on uloric 40mg - no flare ups. Colchicine prn. Uric acid under 6. discussed hctz portion- he wants to remain on.  Never on allopurinol

## 2018-03-30 NOTE — Addendum Note (Signed)
Addended by: Frutoso Chase A on: 03/30/2018 10:07 AM   Modules accepted: Orders

## 2018-03-30 NOTE — Patient Instructions (Addendum)
Flu shot today  We will call you about shingrix- put you on waiting list today  He is going to try scalp cream on legs as it has been more effective. I also told him I would be willing to call in prednisone 20 mg for 7 days if needed if not making improvement.

## 2018-03-30 NOTE — Assessment & Plan Note (Signed)
HLD- on simvastatin 40mg . Update lipids

## 2018-03-30 NOTE — Addendum Note (Signed)
Addended by: Agnes Lawrence on: 03/30/2018 10:07 AM   Modules accepted: Orders

## 2018-03-30 NOTE — Assessment & Plan Note (Signed)
BP controlled on ziac 5-6.25mg  on repeat but high normal blood pressure. advied watch nsaids with BP risks

## 2018-03-30 NOTE — Assessment & Plan Note (Signed)
Psoriasis- on scalp controlled with prn steroids from dermatology- currently some active areas on bilateral shins. Scalp seems to be doing ok. He is going to try scalp cream on legs as it has been more effective. I also told him I would be willing to call in prednisone 20 mg for 7 days if needed if not making improvement.   Also arthritis in [facility] joint of left hand. Given psoriasis will refer to rheumatology though we discussed that this joint is commonly OA as well- he is using naproxen with some relief. Will get rheumatology opinion. Discussed nsaids can raise BP.

## 2018-04-02 ENCOUNTER — Telehealth: Payer: Self-pay | Admitting: Family Medicine

## 2018-04-02 NOTE — Telephone Encounter (Signed)
See result note.  

## 2018-04-02 NOTE — Telephone Encounter (Signed)
See note.  Copied from [place] [id number]. Topic: General - Other >> Apr 02, 2018 10:53 AM Yvette Rack wrote: Reason for CRM: Pt returned call to [facility] in the office. Pt requests call back. Cb# [phone]

## 2018-08-09 ENCOUNTER — Other Ambulatory Visit: Payer: Self-pay

## 2018-08-09 MED ORDER — SIMVASTATIN 40 MG PO TABS: ORAL_TABLET | ORAL | 11 refills | Status: DC

## 2018-08-09 MED ORDER — BISOPROLOL-HYDROCHLOROTHIAZIDE 5-6.25 MG PO TABS: ORAL_TABLET | ORAL | 11 refills | Status: DC

## 2018-09-12 ENCOUNTER — Other Ambulatory Visit: Payer: Self-pay | Admitting: Family Medicine

## 2018-09-26 ENCOUNTER — Telehealth: Payer: Self-pay | Admitting: Family Medicine

## 2018-09-26 NOTE — Telephone Encounter (Signed)
See note

## 2018-09-26 NOTE — Telephone Encounter (Signed)
Copied from [place] [id number]. Topic: Quick Communication - See Telephone Encounter >> Sep 26, 2018  8:32 AM Antonieta Iba C wrote: CRM for notification. See Telephone encounter for: 09/26/18.  Pt says that he changed his insurance and they are now declining to cover medication (Simvastatin 40MG ). Pt says that he need to have another appeal completed. Pt says that his insurance company asked that he have his PCP contact them at the number below to complete appeal.   [place]: [facility], [place] [address] [phone] (Phone) [phone] (Fax)

## 2018-09-27 NOTE — Telephone Encounter (Signed)
You can send in allopurinol 300mg  for daily use #90 with 3 refills. Please let patient know that changes in medications can cause gout flares- reasonable to take a colchicine once a day for first two weeks of taking allopurinol.

## 2018-09-27 NOTE — Telephone Encounter (Signed)
Dr. Yong Channel, is there another medication in place of the Uloric 40 mg that this pt can take, as his insurance company will not cover this medication.  Pt said he has been out for about 2 weeks now.

## 2018-09-27 NOTE — Telephone Encounter (Signed)
I spoke with pt regarding Simvastatin 40 mg, per message.  Pt informed me that Simvastatin is incorrect medication that his insurance will not cover.  Pt said that febuxostat (Uloric 40 mg) is the medication that the insurance is not covering anymore.   I informed pt that I would inform Dr. Yong Channel and get back with him.

## 2018-09-28 NOTE — Telephone Encounter (Signed)
Cody Woodard,  Please look into for Pt, as he said that there is a letter from his insurance was sent to office.

## 2018-09-28 NOTE — Telephone Encounter (Signed)
I called pt back and informed him of the Message from [place] about changing medication and pt does not want to change medication.Pt will discuss at appointment on Monday.

## 2018-09-28 NOTE — Telephone Encounter (Signed)
PA for Uloric is in progress.

## 2018-09-28 NOTE — Telephone Encounter (Signed)
Patient has been notified

## 2018-09-28 NOTE — Telephone Encounter (Signed)
PA for Uloric has been approved.

## 2018-09-28 NOTE — Telephone Encounter (Signed)
Prior Josem Kaufmann has been placed by [employer]. If prior Josem Kaufmann is not approved, I will speak to the pt regarding the med change and instructions.

## 2018-10-01 ENCOUNTER — Encounter: Payer: Self-pay | Admitting: Family Medicine

## 2018-10-01 ENCOUNTER — Ambulatory Visit: Payer: No Typology Code available for payment source | Admitting: Family Medicine

## 2018-10-01 VITALS — BP 104/68 | HR 55 | Temp 97.8°F | Ht 72.0 in | Wt 201.8 lb

## 2018-10-01 DIAGNOSIS — M1A00X0 Idiopathic chronic gout, unspecified site, without tophus (tophi): Secondary | ICD-10-CM | POA: Diagnosis not present

## 2018-10-01 DIAGNOSIS — E785 Hyperlipidemia, unspecified: Secondary | ICD-10-CM | POA: Diagnosis not present

## 2018-10-01 DIAGNOSIS — Z23 Encounter for immunization: Secondary | ICD-10-CM | POA: Diagnosis not present

## 2018-10-01 DIAGNOSIS — E8881 Metabolic syndrome: Secondary | ICD-10-CM | POA: Diagnosis not present

## 2018-10-01 DIAGNOSIS — I10 Essential (primary) hypertension: Secondary | ICD-10-CM

## 2018-10-01 NOTE — Patient Instructions (Addendum)
Shingrix #1 today. Repeat injection in 2-5 months. Schedule a nurse visit for the 2nd injection before you leave today (at the check out desk)  No changes today otherwise- thrilled prior auth went through

## 2018-10-01 NOTE — Progress Notes (Signed)
Phone [phone]   Subjective:  Cody Woodard is a 53 y.o. year old very pleasant male patient who presents for/with See problem oriented charting ROS-  No chest pain or shortness of breath. No headache or blurry vision.     Past Medical History-  Patient Active Problem List   Diagnosis Date Noted  . Essential hypertension 07/20/2010    Priority: Medium  . Chronic gouty arthropathy 02/26/2010    Priority: Medium  . Hyperlipidemia 11/08/2007    Priority: Medium  . Insulin resistance 03/30/2018    Priority: Low  . Adenomatous colon polyp 04/03/2015    Priority: Low  . Former smoker 01/21/2015    Priority: Low  . Back pain with radiation 01/19/2009    Priority: Low  . Psoriasis 02/14/2008    Priority: Low    Medications- reviewed and updated Current Outpatient Medications  Medication Sig Dispense Refill  . betamethasone, augmented, (DIPROLENE) 0.05 % lotion     . bisoprolol-hydrochlorothiazide (ZIAC) 5-6.25 MG tablet TAKE ONE TABLET BY MOUTH ONE TIME DAILY 30 tablet 11  . clobetasol (TEMOVATE) 0.05 % external solution APPLY ONTO THE SKIN/SCALP DAILY AS DIRECTED BY DOCTOR **MAX QTY 25 PER INS**    . Colchicine (MITIGARE) 0.6 MG CAPS Take 2 pills at first sign of gout flare, then take 1 pill 2 hours later if pain persists. 1 pill daily thereafter until flare resolves 30 capsule 5  . febuxostat (ULORIC) 40 MG tablet TAKE 1 TABLET EVERY DAY 30 tablet 3  . hydrocortisone 2.5 % cream     . naproxen sodium (ANAPROX) 220 MG tablet Take 220 mg by mouth as needed.      . simvastatin (ZOCOR) 40 MG tablet TAKE ONE TABLET BY MOUTH ONE TIME DAILY. INS ALLOWS 30 DAYS SUPPLY 30 tablet 11  . triamcinolone cream (KENALOG) 0.1 % APPLY ON TO THE SKIN TWICE A DAY AS NEEDED     No current facility-administered medications for this visit.      Objective:  BP 104/68 (BP Location: Left Arm, Patient Position: Sitting, Cuff Size: Normal)   Pulse (!) 55   Temp 97.8 F (36.6 C) (Oral)   Ht 6'  (1.829 m)   Wt 201 lb 12.8 oz (91.5 kg)   SpO2 96%   BMI 27.37 kg/m  Gen: NAD, resting comfortably CV: RRR no murmurs rubs or gallops Lungs: CTAB no crackles, wheeze, rhonchi Abdomen: soft/nontender/nondistended Ext: no edema Neuro: Normal gait and speech    Assessment and Plan  #Hypertension S: Compliant with Ziac 5-6.25 mg. A/P: Stable. Continue current medications.    #Gout S: Compliant with Uloric 40 mg.  Uses colchicine as needed.  Uric acid level goal under 6.  Aware of risks increased on hydrochlorothiazide.  Never on allopurinol.  Luckily he got approval to continue uloric over changing to allopurinol by writing a letter.  A/P: Stable. Continue current medications.     #Hyperlipidemia S: Compliant with simvastatin 40 mg. Very mild elevation last visit. Heavier clothes today- fluctuations in weight at home- but gotten as low as 191 on home scales  A/P: likely stable with mild elevations- 10 year risk with current levels only 3.7% - we are going to maintain current dose of simvastatin   Intermittent cramps in feet- discussed hydration, mustard, trial 1 week off statin possibly but such an intermittent issue- will hold off.   #Insulin resistance/hyperglycemia S: Fasting CBG slightly elevated. Averaging over 7k steps a day. Reasonably healthy diet A/P: likely stable- update labs next  visit    #Psoriasis S: Uses as needed steroid cream from dermatology.  Has seen rheumatology in the past- [place] arthritis thought to be OA as expected and not psoriatic arthritis  A/P: stable- continue derm follow up.     Future Appointments  Date Time Provider [place]  04/04/2019  8:00 AM Marin Olp, MD [facility]   Return in about 6 months (around 04/01/2019) for physical.  Return precautions advised.  Garret Reddish, MD

## 2018-10-01 NOTE — Addendum Note (Signed)
Addended by: Loralyn Freshwater on: 10/01/2018 10:25 AM   Modules accepted: Orders

## 2019-03-07 ENCOUNTER — Ambulatory Visit (INDEPENDENT_AMBULATORY_CARE_PROVIDER_SITE_OTHER): Payer: No Typology Code available for payment source

## 2019-03-07 ENCOUNTER — Other Ambulatory Visit: Payer: Self-pay

## 2019-03-07 DIAGNOSIS — Z23 Encounter for immunization: Secondary | ICD-10-CM | POA: Diagnosis not present

## 2019-03-07 NOTE — Patient Instructions (Signed)
There are no preventive care reminders to display for this patient.  Depression screen [facility] 2/9 10/01/2018 03/30/2018 07/10/2017  Decreased Interest 0 0 0  Down, Depressed, Hopeless 0 0 0  PHQ - 2 Score 0 0 0

## 2019-03-07 NOTE — Progress Notes (Signed)
Per orders of Dr. Juleen China, injection of Shingles injection given by Blakley Michna L Namish Krise in left deltoid. Patient tolerated injection well. This is second and last one needed.

## 2019-04-03 NOTE — Patient Instructions (Addendum)
Health Maintenance Due  Topic Date Due  . INFLUENZA VACCINE - today 03/09/2019   I like your goal of using 195 as a cap weight- and working on staying 190 or below in general   Please stop by lab before you go If you do not have mychart- we will call you about results within 5 business days of [place] receiving them.  If you have mychart- we will send your results within 3 business days of [place] receiving them.  If abnormal or we want to clarify a result, we will call or mychart you to make sure you receive the message.  If you have questions or concerns or don't hear within 5-7 days, please send [place] a message or call us.

## 2019-04-03 NOTE — Progress Notes (Signed)
Phone: [phone]   Subjective:  Patient presents today for their annual physical. Chief complaint-noted.   See problem oriented charting- ROS- full  review of systems was completed and negative including Denies HA, dizziness, CP, SOB, visual changes.   The following were reviewed and entered/updated in epic: Past Medical History:  Diagnosis Date  . ALLERGIC RHINITIS 11/08/2007  . Blood transfusion without reported diagnosis   . Gouty arthropathy 01/15/2008  . HYPERLIPIDEMIA 11/08/2007  . HYPERTENSION, ESSENTIAL, UNCONTROLLED 07/20/2010  . Olecranon bursitis of left elbow 02/16/2011   Improved now- seen Dr. Oneida Alar in the past.  Dr. Claudia Desanctis is chronic and has been there for years. It rarely flares or causes him problems. He thinks this was traumatic and not related to his gout   . PSORIASIS, SCALP 02/14/2008  . SCIATICA, CHRONIC 01/19/2009  . TENDINITIS, LEFT ELBOW 03/15/2010   Patient Active Problem List   Diagnosis Date Noted  . Essential hypertension 07/20/2010    Priority: Medium  . Chronic gouty arthropathy 02/26/2010    Priority: Medium  . Hyperlipidemia 11/08/2007    Priority: Medium  . Insulin resistance 03/30/2018    Priority: Low  . Adenomatous colon polyp 04/03/2015    Priority: Low  . Former smoker 01/21/2015    Priority: Low  . Back pain with radiation 01/19/2009    Priority: Low  . Psoriasis 02/14/2008    Priority: Low   Past Surgical History:  Procedure Laterality Date  . ear pinning     at age 41  . LACERATION REPAIR     through window age 63  . tendon repari     through window age 34    Family History  Adopted: Yes  Problem Relation Age of Onset  . Other Mother        unknown- adopted    Medications- reviewed and updated Current Outpatient Medications  Medication Sig Dispense Refill  . betamethasone, augmented, (DIPROLENE) 0.05 % lotion     . bisoprolol-hydrochlorothiazide (ZIAC) 5-6.25 MG tablet TAKE ONE TABLET BY MOUTH ONE TIME DAILY 30  tablet 11  . clobetasol (TEMOVATE) 0.05 % external solution APPLY ONTO THE SKIN/SCALP DAILY AS DIRECTED BY DOCTOR **MAX QTY 25 PER INS**    . Colchicine (MITIGARE) 0.6 MG CAPS Take 2 pills at first sign of gout flare, then take 1 pill 2 hours later if pain persists. 1 pill daily thereafter until flare resolves 30 capsule 5  . febuxostat (ULORIC) 40 MG tablet TAKE 1 TABLET EVERY DAY 30 tablet 3  . hydrocortisone 2.5 % cream     . naproxen sodium (ANAPROX) 220 MG tablet Take 220 mg by mouth as needed.      . simvastatin (ZOCOR) 40 MG tablet TAKE ONE TABLET BY MOUTH ONE TIME DAILY. INS ALLOWS 30 DAYS SUPPLY 30 tablet 11  . triamcinolone cream (KENALOG) 0.1 % APPLY ON TO THE SKIN TWICE A DAY AS NEEDED     No current facility-administered medications for this visit.     Allergies-reviewed and updated No Known Allergies  Social History   Social History Narrative   Married ( wife patient here as well). Son in [place] 28 in 2016. 1 granddaughter ( 2 in 2016)      [place]: golfing, music, time with family/friends-enjoying food   Objective  Objective:  BP 136/84 (BP Location: Left Arm, Patient Position: Sitting, Cuff Size: Normal)   Pulse 61   Temp 98.3 F (36.8 C) (Oral)  Ht 6' (1.829 m)   Wt 196 lb 3.2 oz (89 kg)   SpO2 96%   BMI 26.61 kg/m  Gen: NAD, resting comfortably HEENT: Mucous membranes are moist. Oropharynx normal Neck: no thyromegaly CV: RRR no murmurs rubs or gallops Lungs: CTAB no crackles, wheeze, rhonchi Abdomen: soft/nontender/nondistended/normal bowel sounds. No rebound or guarding.  Ext: no edema Skin: warm, dry Neuro: grossly normal, moves all extremities, PERRLA   Assessment and Plan  53 y.o. male presenting for annual physical.  Health Maintenance counseling: 1. Anticipatory guidance: Patient counseled regarding regular dental exams -q6 months outside of covid 19, eye exams - every few years,  avoiding smoking and second hand smoke ,  limiting alcohol to 2 beverages per day - slight increase recently- around 14 a week.   2. Risk factor reduction:  Advised patient of need for regular exercise and diet rich and fruits and vegetables to reduce risk of heart attack and stroke. Exercise- usually 7k-8k steps but recently with covid has been down some- needs to get out more and should be easier with weather improving. Diet-down 2 lbs from last year- got down to 189 but came back up with snacking. Overweight status noted.  Wt Readings from Last 3 Encounters:  04/04/19 196 lb 3.2 oz (89 kg)  10/01/18 201 lb 12.8 oz (91.5 kg)  03/30/18 198 lb (89.8 kg)  3. Immunizations/screenings/ancillary studies- flu shot today Immunization History  Administered Date(s) Administered  . Influenza Split 06/13/2012  . Influenza,inj,Quad PF,6+ Mos 04/28/2016, 07/10/2017, 03/30/2018  . Td 01/06/2001  . Tdap 03/11/2011  . Zoster Recombinat (Shingrix) 10/01/2018, 03/07/2019  4. Prostate cancer screening- low risk prior psa trend.defer rectal unless psa trend concerning  Lab Results  Component Value Date   PSA 1.02 03/30/2018   PSA 1.13 04/21/2016   PSA 0.77 03/04/2011   5. Colon cancer screening - Hx of colon polyp - 03/2015 with 5 year repeat 6. Skin cancer screening- Dr. Ronnald Ramp regularly- is going to get follow up with psoriasis worsening (thinking about trying wife's dermatologist). advised regular sunscreen use. Denies worrisome, changing, or new skin lesions.  7. former smoker- quit 10 years ago. aaa screen planned at 50. UA today. Under 30 pack years- no lung cancer screening   Status of chronic or acute concerns  Hypertension - Taking Bisoprolol-HCTZ 5-6.25 mg BID- occasional misses. Not checking at home.   Insulin Resistance - slightly high cbg in the past- will get cbg as well as a1c  Gout - Taking Uloric 40 mg daily and Colchicine 0.6 mg prn. No flare-ups.   Hyperlipidemia - Taking Simvastatin 40 mg daily. Update lipids. Missing some  doses.   Tendinitis in elbows- tries to do his exercises.   Psoriasis- see above  #social update- he is working from home (did some before), wife lost job with [employer] unfortunately but looking for work now, son and family in [place] with [employer], in Pharmacist, hospital moving to town. Parents in 16s in [place]- some stress as couldn't get to them if needed right now  Recommended follow up: 6 month follow up   Lab/Order associations: fasting    ICD-10-CM   1. Preventative health care  Z00.00 CBC    Comprehensive metabolic panel    PSA    Lipid panel    Hemoglobin A1c    Uric acid    POCT Urinalysis Dipstick (Automated)  2. Essential hypertension  I10 CBC    Comprehensive metabolic panel  3. Chronic gouty arthropathy  M1A.00X0 Uric acid  4. Hyperlipidemia, unspecified hyperlipidemia type  E78.5 CBC    Comprehensive metabolic panel    Lipid panel    POCT Urinalysis Dipstick (Automated)  5. Former smoker  Z87.891 POCT Urinalysis Dipstick (Automated)  6. Insulin resistance  E88.81 Hemoglobin A1c  7. Screening for prostate cancer  Z12.5   8. Overweight  E66.3   states no refills at this time (has some excess)   Return precautions advised.  Garret Reddish, MD

## 2019-04-04 ENCOUNTER — Encounter: Payer: Self-pay | Admitting: Family Medicine

## 2019-04-04 ENCOUNTER — Other Ambulatory Visit: Payer: Self-pay

## 2019-04-04 ENCOUNTER — Ambulatory Visit (INDEPENDENT_AMBULATORY_CARE_PROVIDER_SITE_OTHER): Payer: No Typology Code available for payment source | Admitting: Family Medicine

## 2019-04-04 VITALS — BP 136/84 | HR 61 | Temp 98.3°F | Ht 72.0 in | Wt 196.2 lb

## 2019-04-04 DIAGNOSIS — Z23 Encounter for immunization: Secondary | ICD-10-CM | POA: Diagnosis not present

## 2019-04-04 DIAGNOSIS — E785 Hyperlipidemia, unspecified: Secondary | ICD-10-CM | POA: Diagnosis not present

## 2019-04-04 DIAGNOSIS — Z125 Encounter for screening for malignant neoplasm of prostate: Secondary | ICD-10-CM | POA: Diagnosis not present

## 2019-04-04 DIAGNOSIS — I10 Essential (primary) hypertension: Secondary | ICD-10-CM | POA: Diagnosis not present

## 2019-04-04 DIAGNOSIS — Z0000 Encounter for general adult medical examination without abnormal findings: Secondary | ICD-10-CM

## 2019-04-04 DIAGNOSIS — E8881 Metabolic syndrome: Secondary | ICD-10-CM | POA: Diagnosis not present

## 2019-04-04 DIAGNOSIS — M1A00X0 Idiopathic chronic gout, unspecified site, without tophus (tophi): Secondary | ICD-10-CM | POA: Diagnosis not present

## 2019-04-04 DIAGNOSIS — Z87891 Personal history of nicotine dependence: Secondary | ICD-10-CM

## 2019-04-04 DIAGNOSIS — E663 Overweight: Secondary | ICD-10-CM

## 2019-04-04 LAB — HEMOGLOBIN A1C: Hgb A1c MFr Bld: 5.9 % (ref 4.6–6.5)

## 2019-04-04 LAB — COMPREHENSIVE METABOLIC PANEL
ALT: 36 U/L (ref 0–53)
AST: 28 U/L (ref 0–37)
Albumin: 4.7 g/dL (ref 3.5–5.2)
Alkaline Phosphatase: 44 U/L (ref 39–117)
BUN: 15 mg/dL (ref 6–23)
CO2: 28 mEq/L (ref 19–32)
Calcium: 10 mg/dL (ref 8.4–10.5)
Chloride: 101 mEq/L (ref 96–112)
Creatinine, Ser: 0.98 mg/dL (ref 0.40–1.50)
GFR: 79.52 mL/min (ref >60.00)
Glucose, Bld: 110 mg/dL — ABNORMAL HIGH (ref 70–99)
Potassium: 4.7 mEq/L (ref 3.5–5.1)
Sodium: 138 mEq/L (ref 135–145)
Total Bilirubin: 0.6 mg/dL (ref 0.2–1.2)
Total Protein: 7.4 g/dL (ref 6.0–8.3)

## 2019-04-04 LAB — PSA: PSA: 1.11 ng/mL (ref 0.10–4.00)

## 2019-04-04 LAB — POC URINALSYSI DIPSTICK (AUTOMATED)
Bilirubin, UA: NEGATIVE
Blood, UA: NEGATIVE
Glucose, UA: NEGATIVE
Ketones, UA: NEGATIVE
Leukocytes, UA: NEGATIVE
Nitrite, UA: NEGATIVE
Protein, UA: NEGATIVE
Spec Grav, UA: 1.015 (ref 1.010–1.025)
Urobilinogen, UA: 1 E.U./dL
pH, UA: 7.5 (ref 5.0–8.0)

## 2019-04-04 LAB — CBC
HCT: 43.3 % (ref 39.0–52.0)
Hemoglobin: 14.9 g/dL (ref 13.0–17.0)
MCHC: 34.6 g/dL (ref 30.0–36.0)
MCV: 97 fl (ref 78.0–100.0)
Platelets: 199 10*3/uL (ref 150.0–400.0)
RBC: 4.46 Mil/uL (ref 4.22–5.81)
RDW: 12.9 % (ref 11.5–15.5)
WBC: 5.4 10*3/uL (ref 4.0–10.5)

## 2019-04-04 LAB — LIPID PANEL
Cholesterol: 278 mg/dL — ABNORMAL HIGH (ref 0–200)
HDL: 60.5 mg/dL (ref >39.00)
LDL Cholesterol: 187 mg/dL — ABNORMAL HIGH (ref 0–99)
NonHDL: 217.78
Total CHOL/HDL Ratio: 5
Triglycerides: 154 mg/dL — ABNORMAL HIGH (ref 0.0–149.0)
VLDL: 30.8 mg/dL (ref 0.0–40.0)

## 2019-04-04 LAB — URIC ACID: Uric Acid, Serum: 7.2 mg/dL (ref 4.0–7.8)

## 2019-04-04 NOTE — Addendum Note (Signed)
Addended by: Jasper Loser on: 04/04/2019 11:25 AM   Modules accepted: Orders

## 2019-04-04 NOTE — Addendum Note (Signed)
Addended by: Francis Dowse T on: 04/04/2019 08:42 AM   Modules accepted: Orders

## 2019-05-08 ENCOUNTER — Other Ambulatory Visit: Payer: Self-pay | Admitting: Family Medicine

## 2019-09-16 ENCOUNTER — Other Ambulatory Visit: Payer: Self-pay | Admitting: Family Medicine

## 2019-10-10 ENCOUNTER — Ambulatory Visit: Payer: No Typology Code available for payment source | Admitting: Family Medicine

## 2019-10-24 ENCOUNTER — Ambulatory Visit: Payer: No Typology Code available for payment source | Attending: Internal Medicine

## 2019-10-24 DIAGNOSIS — Z23 Encounter for immunization: Secondary | ICD-10-CM

## 2019-10-24 NOTE — Progress Notes (Signed)
   Covid-19 Vaccination Clinic  Name:  Cody Woodard    MRN: [id number] DOB: 09/11/1965  10/24/2019  Cody Woodard was observed post Covid-19 immunization for 15 minutes without incident. He was provided with Vaccine Information Sheet and instruction to access the V-Safe system.   Cody Woodard was instructed to call 911 with any severe reactions post vaccine: Marland Kitchen Difficulty breathing  . Swelling of face and throat  . A fast heartbeat  . A bad rash all over body  . Dizziness and weakness   Immunizations Administered    Name Date Dose VIS Date Route   Pfizer COVID-19 Vaccine 10/24/2019  2:59 PM 0.3 mL 07/19/2019 Intramuscular   Manufacturer: [place]   Lot: [id number]   Cody Woodard: [id number]

## 2019-11-18 ENCOUNTER — Ambulatory Visit: Payer: No Typology Code available for payment source | Attending: Internal Medicine

## 2019-11-18 DIAGNOSIS — Z23 Encounter for immunization: Secondary | ICD-10-CM

## 2019-11-18 NOTE — Progress Notes (Signed)
   Covid-19 Vaccination Clinic  Name:  Cody Woodard    MRN: [id number] DOB: [birth year]  11/18/2019  [name] was observed post Covid-19 immunization for 15 minutes without incident. He was provided with Vaccine Information Sheet and instruction to access the V-Safe system.   [name] was instructed to call 911 with any severe reactions post vaccine: Marland Kitchen Difficulty breathing  . Swelling of face and throat  . A fast heartbeat  . A bad rash all over body  . Dizziness and weakness   Immunizations Administered    Name Date Dose VIS Date Route   Pfizer COVID-19 Vaccine 11/18/2019  3:29 PM 0.3 mL 07/19/2019 Intramuscular   Manufacturer: [place]   Lot: [id number]   [place]: [id number]

## 2019-12-12 ENCOUNTER — Other Ambulatory Visit: Payer: Self-pay | Admitting: Family Medicine

## 2019-12-22 ENCOUNTER — Other Ambulatory Visit: Payer: Self-pay | Admitting: Family Medicine

## 2020-01-15 ENCOUNTER — Encounter: Payer: Self-pay | Admitting: Gastroenterology

## 2020-04-22 ENCOUNTER — Telehealth: Payer: Self-pay | Admitting: Family Medicine

## 2020-04-24 NOTE — Telephone Encounter (Signed)
Patient has called in to schedule appt.  I have scheduled patient for CPE on next available date which is 1/25.    Can medication be refilled through this date?    Please advise.

## 2020-04-24 NOTE — Telephone Encounter (Signed)
Yes thanks may refill 

## 2020-04-27 MED ORDER — SIMVASTATIN 40 MG PO TABS: ORAL_TABLET | ORAL | 4 refills | Status: DC

## 2020-04-27 NOTE — Addendum Note (Signed)
Addended by: Thomes Cake on: 04/27/2020 09:04 AM   Modules accepted: Orders

## 2020-07-20 ENCOUNTER — Other Ambulatory Visit: Payer: Self-pay | Admitting: Family Medicine

## 2020-08-31 NOTE — Patient Instructions (Addendum)
Please stop by lab before you go If you have mychart- we will send your results within 3 business days of [place] receiving them.  If you do not have mychart- we will call you about results within 5 business days of [place] receiving them.  *please also note that you will see labs on mychart as soon as they post. I will later go in and write notes on them- will say "notes from Dr. Yong Channel"  We will call you within two weeks about your referral to GI for colonoscopy and for lung cancer screening If you do not hear within 2 weeks, give [place] a call.   Schedule follow up with your dentist  Blood pressure is slightly high today.  In office would like for her to be less than 140/90.  For at home readings prefer less than 135/85-I asked for him to consider purchasing a home cuff and monitoring at home-this will be really helpful to know if we need to increase medication further or simply intensify lifestyle changes. Would love for him to update me within a month with home blood pressures at least 2-3x a week. Can see each other sooner if blood pressure not controlled.   Team please give rotator cuff/bursitis handout. Try these 3x a week for a month- stop any exercise that increase pain more tan 1-2/10. After a month try to maintain at least once a week for another 2 months. Can refer to physical therapy if needed.   Health Maintenance Due  Topic Date Due  . INFLUENZA VACCINE - done on 07/10/20 at [facility] [place]- team please update 03/08/2020  . COVID-19 Vaccine (3 - Booster for Pfizer series)done on 07/10/20 at [facility] on [place]- team please update 05/19/2020

## 2020-08-31 NOTE — Progress Notes (Signed)
Phone: [phone]   Subjective:  Patient presents today for their annual physical. Chief complaint-noted.   See problem oriented charting- ROS- full  review of systems was completed and negative  except for: dizziness, rash with psoriasis, joint pain- thumb is worse  The following were reviewed and entered/updated in epic: Past Medical History:  Diagnosis Date  . ALLERGIC RHINITIS 11/08/2007  . Blood transfusion without reported diagnosis   . Gouty arthropathy 01/15/2008  . HYPERLIPIDEMIA 11/08/2007  . HYPERTENSION, ESSENTIAL, UNCONTROLLED 07/20/2010  . Olecranon bursitis of left elbow 02/16/2011   Improved now- seen Dr. Oneida Alar in the past.  Dr. Claudia Desanctis is chronic and has been there for years. It rarely flares or causes him problems. He thinks this was traumatic and not related to his gout   . PSORIASIS, SCALP 02/14/2008  . SCIATICA, CHRONIC 01/19/2009  . TENDINITIS, LEFT ELBOW 03/15/2010   Patient Active Problem List   Diagnosis Date Noted  . Essential hypertension 07/20/2010    Priority: Medium  . Chronic gouty arthropathy 02/26/2010    Priority: Medium  . Hyperlipidemia 11/08/2007    Priority: Medium  . Insulin resistance 03/30/2018    Priority: Low  . Adenomatous colon polyp 04/03/2015    Priority: Low  . Former smoker 01/21/2015    Priority: Low  . Back pain with radiation 01/19/2009    Priority: Low  . Psoriasis 02/14/2008    Priority: Low   Past Surgical History:  Procedure Laterality Date  . CATARACT EXTRACTION, BILATERAL    . ear pinning     at age 87  . LACERATION REPAIR     through window age 59  . tendon repari     through window age 67    Family History  Adopted: Yes  Problem Relation Age of Onset  . Other Mother        unknown- adopted    Medications- reviewed and updated Current Outpatient Medications  Medication Sig Dispense Refill  . betamethasone, augmented, (DIPROLENE) 0.05 % lotion     . bisoprolol-hydrochlorothiazide (ZIAC) 5-6.25 MG  tablet TAKE 1 TABLET BY MOUTH EVERY DAY 270 tablet 1  . clobetasol (TEMOVATE) 0.05 % external solution APPLY ONTO THE SKIN/SCALP DAILY AS DIRECTED BY DOCTOR **MAX QTY 25 PER INS**    . Colchicine (MITIGARE) 0.6 MG CAPS Take 2 pills at first sign of gout flare, then take 1 pill 2 hours later if pain persists. 1 pill daily thereafter until flare resolves 30 capsule 5  . naproxen sodium (ANAPROX) 220 MG tablet Take 220 mg by mouth as needed.    . simvastatin (ZOCOR) 40 MG tablet TAKE ONE TABLET BY MOUTH ONE TIME DAILY. INS ALLOWS 30 DAYS SUPPLY 90 tablet 1  . triamcinolone cream (KENALOG) 0.1 % APPLY ON TO THE SKIN TWICE A DAY AS NEEDED    . febuxostat (ULORIC) 40 MG tablet Take 1 tablet (40 mg total) by mouth daily. 30 tablet 11  . hydrocortisone 2.5 % cream      No current facility-administered medications for this visit.    Allergies-reviewed and updated No Known Allergies  Social History   Social History Narrative   Married ( wife patient here as well). Son in [place] 28 in 2016. 1 granddaughter ( 2 in 2016)      [place]: golfing, music, time with family/friends-enjoying food   Objective  Objective:  BP 138/90   Pulse 95   Temp (!) 97 F (36.1 C)  Ht 6' (1.829 m)   Wt 202 lb 9.6 oz (91.9 kg)   SpO2 97%   BMI 27.48 kg/m  Gen: NAD, resting comfortably HEENT: Mucous membranes are moist. Oropharynx normal Neck: no thyromegaly CV: RRR no murmurs rubs or gallops Lungs: CTAB no crackles, wheeze, rhonchi Abdomen: soft/nontender/nondistended/normal bowel sounds. No rebound or guarding.  Ext: no edema Skin: warm, dry Neuro: grossly normal, moves all extremities, PERRLA Msk: Positive Hawkin and Neer test.  Negative empty can.  Good rotator cuff strength.   Assessment and Plan  55 y.o. male presenting for annual physical.  Health Maintenance counseling: 1. Anticipatory guidance: Patient counseled regarding regular dental exams - needs to update q6 months,  eye exams yes yearly,  avoiding smoking and second hand smoke- yes , limiting alcohol to 2 beverages per day- about 14 a week .   2. Risk factor reduction:  Advised patient of need for regular exercise and diet rich and fruits and vegetables to reduce risk of heart attack and stroke. Exercise- not as much lately- about 6k steps a day- but would do more in summer months. Diet- cooking at home for most part- does well in week but feels eats out a fair amount on weekends and does more snacking than usual- could cut back. Up 6 lbs from last visit- winter is a hard stretch with holidays and birthdays Wt Readings from Last 3 Encounters:  09/01/20 202 lb 9.6 oz (91.9 kg)  04/04/19 196 lb 3.2 oz (89 kg)  10/01/18 201 lb 12.8 oz (91.5 kg)  3. Immunizations/screenings/ancillary studies- discussed HCV screen . Already had flu shot and covid 19 vaccine.  Immunization History  Administered Date(s) Administered  . Influenza Split 06/13/2012  . Influenza,inj,Quad PF,6+ Mos 04/28/2016, 07/10/2017, 03/30/2018, 04/04/2019  . PFIZER(Purple Top)SARS-COV-2 Vaccination 10/24/2019, 11/18/2019  . Td 01/06/2001  . Tdap 03/11/2011  . Zoster Recombinat (Shingrix) 10/01/2018, 03/07/2019   4. Prostate cancer screening-  low risk prior psa trend- will trend today. No significant change in urinary symptoms Lab Results  Component Value Date   PSA 1.11 04/04/2019   PSA 1.02 03/30/2018   PSA 1.13 04/21/2016   5. Colon cancer screening - 03/2015 with history of colon polyp. Will place referral as now due for 5 year follow up 6. Skin cancer screening- Dr. Ronnald Ramp recently- removed cyst from back. advised regular sunscreen use. Denies worrisome, changing, or new skin lesions.  7. Former smoker- quit 2009. AAA screen planned at 17. UA today. 23 pack years-  8. STD screening - declines  Status of chronic or acute concerns   #Hypertension S: Compliant with Ziac 5-6.25 mg last night Home #s- not checking.  BP Readings from Last  3 Encounters:  09/01/20 138/90  04/04/19 136/84  10/01/18 104/68  A/P: Blood pressure is slightly high today.  In office would like for her to be less than 140/90.  For at home readings prefer less than 135/85-I asked for him to consider purchasing a home cuff and monitoring at home-this will be really helpful to know if we need to increase medication further or simply intensify lifestyle changes. Would love for him to update me within a month with home blood pressures at least 2-3x a week.   #Gout S: Compliant with Uloric 40 mg.  Uses colchicine as needed- has not had to take.  Uric acid level goal under 6 in pats- but not last visit- intermittently taking uloric.  Aware of risks increased on hydrochlorothiazide.  Never on allopurinol. Lab Results  Component Value Date   LABURIC 7.2 04/04/2019  A/P: hopefully uric acid at goal- main thing is he is not having flares- continue current meds    #Hyperlipidemia S: Compliant with simvastatin 40 mg Lab Results  Component Value Date   CHOL 278 (H) 04/04/2019   HDL 60.50 04/04/2019   LDLCALC 187 (H) 04/04/2019   LDLDIRECT 115.5 07/11/2011   TRIG 154.0 (H) 04/04/2019   CHOLHDL 5 04/04/2019   A/P: looking for at least 30% reduction in LDL back on statin on labs today- prefer to not be very aggressive due to prediabetes  #Insulin resistance/hyperglycemia S: Fasting CBG slightly elevated Lab Results  Component Value Date   HGBA1C 5.9 04/04/2019  A/P: hopefully prediabetes stable- update a1c    #Psoriasis S: Uses as needed steroid cream from dermatology.  Has seen rheumatology in the past- [place] arthritis thought to be OA as expected and not psoriatic arthritis  -worse in winter with less son exposure A/P: stable- continue prn steroids   #dizziness vs vertigo- had a prolonged episode of 4 hours. No headache. Has not had recurrence. Happened about a month ago. Mild nauseous. Felt like he was on a boat- sounds like vertigo. Worse with position  change. May have been BPPV will monitor.   #Left shoulder pain-patient with some issues in certain positions with his left shoulder-examination today shows potential rotator cuff injury.  Home exercises were given-see after visit summary.  Can refer to physical therapy if fails to improve or symptoms worsen  Recommended follow up: Return in about 6 months (around 03/01/2021) for follow up- or sooner if needed.  Lab/Order associations: fasting   ICD-10-CM   1. Preventative health care  Z00.00 CBC with Differential/Platelet    Comprehensive metabolic panel    Lipid panel    Hemoglobin A1c    Uric acid    Hepatitis C antibody    Ambulatory referral to Gastroenterology    Ambulatory Referral for Lung Cancer Scre  2. Essential hypertension  I10   3. Hyperlipidemia, unspecified hyperlipidemia type  E78.5 CBC with Differential/Platelet    Comprehensive metabolic panel    Lipid panel  4. Chronic gouty arthropathy  M1A.00X0 Uric acid  5. Insulin resistance  E88.81 Hemoglobin A1c  6. Encounter for hepatitis C screening test for low risk patient  Z11.59 Hepatitis C antibody  7. Adenomatous polyp of colon, unspecified part of colon  D12.6 Ambulatory referral to Gastroenterology  8. Former smoker  Z87.891 Ambulatory Referral for Lung Cancer Scre    Meds ordered this encounter  Medications  . febuxostat (ULORIC) 40 MG tablet    Sig: Take 1 tablet (40 mg total) by mouth daily.    Dispense:  30 tablet    Refill:  11   Return precautions advised.  Garret Reddish, MD

## 2020-09-01 ENCOUNTER — Encounter: Payer: Self-pay | Admitting: Family Medicine

## 2020-09-01 ENCOUNTER — Ambulatory Visit (INDEPENDENT_AMBULATORY_CARE_PROVIDER_SITE_OTHER): Payer: No Typology Code available for payment source | Admitting: Family Medicine

## 2020-09-01 ENCOUNTER — Other Ambulatory Visit: Payer: Self-pay

## 2020-09-01 VITALS — BP 138/90 | HR 95 | Temp 97.0°F | Ht 72.0 in | Wt 202.6 lb

## 2020-09-01 DIAGNOSIS — Z1159 Encounter for screening for other viral diseases: Secondary | ICD-10-CM

## 2020-09-01 DIAGNOSIS — Z87891 Personal history of nicotine dependence: Secondary | ICD-10-CM

## 2020-09-01 DIAGNOSIS — M1A00X0 Idiopathic chronic gout, unspecified site, without tophus (tophi): Secondary | ICD-10-CM

## 2020-09-01 DIAGNOSIS — E8881 Metabolic syndrome: Secondary | ICD-10-CM

## 2020-09-01 DIAGNOSIS — D126 Benign neoplasm of colon, unspecified: Secondary | ICD-10-CM

## 2020-09-01 DIAGNOSIS — Z0000 Encounter for general adult medical examination without abnormal findings: Secondary | ICD-10-CM | POA: Diagnosis not present

## 2020-09-01 DIAGNOSIS — E785 Hyperlipidemia, unspecified: Secondary | ICD-10-CM | POA: Diagnosis not present

## 2020-09-01 DIAGNOSIS — I10 Essential (primary) hypertension: Secondary | ICD-10-CM | POA: Diagnosis not present

## 2020-09-01 LAB — COMPREHENSIVE METABOLIC PANEL
ALT: 29 U/L (ref 0–53)
AST: 26 U/L (ref 0–37)
Albumin: 4.8 g/dL (ref 3.5–5.2)
Alkaline Phosphatase: 40 U/L (ref 39–117)
BUN: 22 mg/dL (ref 6–23)
CO2: 30 mEq/L (ref 19–32)
Calcium: 10.3 mg/dL (ref 8.4–10.5)
Chloride: 101 mEq/L (ref 96–112)
Creatinine, Ser: 0.99 mg/dL (ref 0.40–1.50)
GFR: 85.43 mL/min (ref >60.00)
Glucose, Bld: 114 mg/dL — ABNORMAL HIGH (ref 70–99)
Potassium: 4.5 mEq/L (ref 3.5–5.1)
Sodium: 137 mEq/L (ref 135–145)
Total Bilirubin: 0.9 mg/dL (ref 0.2–1.2)
Total Protein: 7.4 g/dL (ref 6.0–8.3)

## 2020-09-01 LAB — CBC WITH DIFFERENTIAL/PLATELET
Basophils Absolute: 0 10*3/uL (ref 0.0–0.1)
Basophils Relative: 0.5 % (ref 0.0–3.0)
Eosinophils Absolute: 0.1 10*3/uL (ref 0.0–0.7)
Eosinophils Relative: 1.4 % (ref 0.0–5.0)
HCT: 42.3 % (ref 39.0–52.0)
Hemoglobin: 14.7 g/dL (ref 13.0–17.0)
Lymphocytes Relative: 28.5 % (ref 12.0–46.0)
Lymphs Abs: 1.5 10*3/uL (ref 0.7–4.0)
MCHC: 34.8 g/dL (ref 30.0–36.0)
MCV: 96.5 fl (ref 78.0–100.0)
Monocytes Absolute: 0.6 10*3/uL (ref 0.1–1.0)
Monocytes Relative: 12.2 % — ABNORMAL HIGH (ref 3.0–12.0)
Neutro Abs: 3 10*3/uL (ref 1.4–7.7)
Neutrophils Relative %: 57.4 % (ref 43.0–77.0)
Platelets: 208 10*3/uL (ref 150.0–400.0)
RBC: 4.38 Mil/uL (ref 4.22–5.81)
RDW: 12.8 % (ref 11.5–15.5)
WBC: 5.2 10*3/uL (ref 4.0–10.5)

## 2020-09-01 LAB — LIPID PANEL
Cholesterol: 218 mg/dL — ABNORMAL HIGH (ref 0–200)
HDL: 50.3 mg/dL (ref >39.00)
LDL Cholesterol: 134 mg/dL — ABNORMAL HIGH (ref 0–99)
NonHDL: 167.74
Total CHOL/HDL Ratio: 4
Triglycerides: 167 mg/dL — ABNORMAL HIGH (ref 0.0–149.0)
VLDL: 33.4 mg/dL (ref 0.0–40.0)

## 2020-09-01 LAB — HEMOGLOBIN A1C: Hgb A1c MFr Bld: 5.9 % (ref 4.6–6.5)

## 2020-09-01 LAB — URIC ACID: Uric Acid, Serum: 7.3 mg/dL (ref 4.0–7.8)

## 2020-09-01 MED ORDER — FEBUXOSTAT 40 MG PO TABS: 40.0000 mg | ORAL_TABLET | Freq: Every day | ORAL | 11 refills | Status: DC

## 2020-09-02 LAB — HEPATITIS C ANTIBODY
Hepatitis C Ab: NONREACTIVE
SIGNAL TO CUT-OFF: 0 (ref <1.00)

## 2020-11-02 ENCOUNTER — Telehealth: Payer: Self-pay | Admitting: *Deleted

## 2020-11-02 NOTE — Telephone Encounter (Signed)
Patient no showed PV today- Called patient and left message to return call by 5 pm today- If no call by 5 pm, PV and procedure will be canceled - no call at 5 pm -  PV and Procedure both canceled- No Show letter mailed to patient  

## 2020-11-16 ENCOUNTER — Encounter: Payer: No Typology Code available for payment source | Admitting: Gastroenterology

## 2021-02-02 ENCOUNTER — Telehealth (INDEPENDENT_AMBULATORY_CARE_PROVIDER_SITE_OTHER): Payer: No Typology Code available for payment source | Admitting: Family Medicine

## 2021-02-02 ENCOUNTER — Encounter: Payer: Self-pay | Admitting: Family Medicine

## 2021-02-02 VITALS — BP 147/95 | HR 73 | Temp 97.9°F | Ht 72.0 in | Wt 197.0 lb

## 2021-02-02 DIAGNOSIS — M25512 Pain in left shoulder: Secondary | ICD-10-CM | POA: Diagnosis not present

## 2021-02-02 DIAGNOSIS — I10 Essential (primary) hypertension: Secondary | ICD-10-CM

## 2021-02-02 DIAGNOSIS — G8929 Other chronic pain: Secondary | ICD-10-CM

## 2021-02-02 DIAGNOSIS — U071 COVID-19: Secondary | ICD-10-CM | POA: Diagnosis not present

## 2021-02-02 MED ORDER — AMLODIPINE BESYLATE 2.5 MG PO TABS: 2.5000 mg | ORAL_TABLET | Freq: Every day | ORAL | 3 refills | Status: DC

## 2021-02-02 NOTE — Patient Instructions (Addendum)
Health Maintenance Due  Topic Date Due   COLONOSCOPY (Pts 45-86yrs Insurance coverage will need to be confirmed) Patient has tried for over a week to reschedule this appointment unable to get in touch with anybody.  03/25/2020   COVID-19 Vaccine (4 - Booster for [employer] series) Patient will wait until the fall to get this scheduled.  11/08/2020    Recommended follow up: No follow-ups on file.

## 2021-02-02 NOTE — Progress Notes (Signed)
Phone [phone] Virtual visit via Video note   Subjective:  Chief complaint: Chief Complaint  Patient presents with   Hypertension   Covid Positive    Patient did a home test and it came back positive. Patient states that he was having joint pain, fatigue, feel hot but no fever, stuffy nose and scratchy throat. States that early this week he had a negative PCR     This visit type was conducted due to national recommendations for restrictions regarding the COVID-19 Pandemic (e.g. social distancing).  This format is felt to be most appropriate for this patient at this time balancing risks to patient and risks to population by having him in for in person visit.  No physical exam was performed (except for noted visual exam or audio findings with Telehealth visits).    Our team/I connected with Jolee Ewing at  1:20 PM EDT by a video enabled telemedicine application (doxy.me or caregility through epic) and verified that I am speaking with the correct person using two identifiers.  Location patient: [facility] Location provider: [facility], office Persons participating in the virtual visit:  patient  Our team/I discussed the limitations of evaluation and management by telemedicine and the availability of in person appointments. In light of current covid-19 pandemic, patient also understands that we are trying to protect them by minimizing in office contact if at all possible.  The patient expressed consent for telemedicine visit and agreed to proceed. Patient understands insurance will be billed.   Past Medical History-  Patient Active Problem List   Diagnosis Date Noted   Essential hypertension 07/20/2010    Priority: Medium   Chronic gouty arthropathy 02/26/2010    Priority: Medium   Hyperlipidemia 11/08/2007    Priority: Medium   Insulin resistance 03/30/2018    Priority: Low   Adenomatous colon polyp 04/03/2015    Priority: Low   Former smoker 01/21/2015    Priority: Low    Back pain with radiation 01/19/2009    Priority: Low   Psoriasis 02/14/2008    Priority: Low    Medications- reviewed and updated Current Outpatient Medications  Medication Sig Dispense Refill   amLODipine (NORVASC) 2.5 MG tablet Take 1 tablet (2.5 mg total) by mouth daily. 90 tablet 3   betamethasone, augmented, (DIPROLENE) 0.05 % lotion      bisoprolol-hydrochlorothiazide (ZIAC) 5-6.25 MG tablet TAKE 1 TABLET BY MOUTH EVERY DAY 270 tablet 1   clobetasol (TEMOVATE) 0.05 % external solution APPLY ONTO THE SKIN/SCALP DAILY AS DIRECTED BY DOCTOR **MAX QTY 25 PER INS**     febuxostat (ULORIC) 40 MG tablet Take 1 tablet (40 mg total) by mouth daily. 30 tablet 11   naproxen sodium (ANAPROX) 220 MG tablet Take 220 mg by mouth as needed.     simvastatin (ZOCOR) 40 MG tablet TAKE ONE TABLET BY MOUTH ONE TIME DAILY. INS ALLOWS 30 DAYS SUPPLY 90 tablet 1   triamcinolone cream (KENALOG) 0.1 % APPLY ON TO THE SKIN TWICE A DAY AS NEEDED     Colchicine (MITIGARE) 0.6 MG CAPS Take 2 pills at first sign of gout flare, then take 1 pill 2 hours later if pain persists. 1 pill daily thereafter until flare resolves (Patient not taking: Reported on 02/02/2021) 30 capsule 5   No current facility-administered medications for this visit.     Objective:  BP (!) 147/95   Pulse 73   Temp 97.9 F (36.6 C) (Oral)   Ht 6' (1.829 m)   Wt 197 lb (  89.4 kg)   BMI 26.72 kg/m  self reported vitals Gen: NAD, resting comfortably Lungs: nonlabored, normal respiratory rate  Skin: appears dry, no obvious rash     Assessment and Plan   # covid positive S:patient reports first day of symptoms last night with sniffle, scratchy throat, fatigue. Hasnt had cold in 10 years. Rapid test positive today. Did a covid pcr test after coming back from [place] and having a rash- was tested in urgent care facility. Luckily rash has resolved- no clear trigger identified  No fever, chills. No shortness of breath. Has had some feverish  feelings though but temp only 97s.  A/P: Patient with testing confirming covid 19 with first day of covid 19 symptoms 02/01/21 Vaccination status:vaccinated x3 with last vaccine 07/10/20   Therefore: - recommended patient watch closely for shortness of breath or confusion or worsening symptoms and if those occur patient should contact us immediately or seek care in the emergency department -recommended patient consider purchasing pulse oximeter and if levels 94% or below persistently- seek care at the hospital - Patient needs to self isolate  for at least 5 days since first symptom AND at least 24 hours fever free without fever reducing medications AND have improvement in respiratory symptoms . After 5 days can end self isolation but still needs to wear mask for additional 5 days .  - work note not needed- can work from home. Apparently needs pcr test to return to work oddly- discussed can be positive up to 90 days -Patient should inform close contacts about exposure (anyone patient been around unmasked for more than 15 minutes)   If High risk for complications-we discussed outpatient therapeutic options including paxlovid (and risk of rebound), molnupiravir, MAB infusion - patient opted out for now- discussed up to 5 days of symptoms- he wants to consider this if symptoms worsen.   #Hypertension S: Compliant with Ziac 5-6.25 mg. Home reading #s:in the last week has been running in 140s. Saturday when taking 170/110. Took another pill yesterday and waited an hour but no improvement in BP - was still running 150s. Today was down to 141 this AM.  -even before recent illness was always 140s BP Readings from Last 3 Encounters:  02/02/21 (!) 147/95  09/01/20 138/90  04/04/19 136/84  A/P: Poor control of blood pressure with home readings consistently above 140 even prior to [place].  We opted to continue Ziac at low-dose and add in amlodipine 2.5 mg at low-dose before bed.  I asked him to update me with  home readings in approximately 2 to 3 weeks.  Home goal blood pressure is less than 135/85  #Hyperlipidemia S: Compliant with simvastatin 40 mg. Pretty stable on home scales to stlight loss- had gained on vacation Lab Results  Component Value Date   CHOL 218 (H) 09/01/2020   HDL 50.30 09/01/2020   LDLCALC 134 (H) 09/01/2020   LDLDIRECT 115.5 07/11/2011   TRIG 167.0 (H) 09/01/2020   CHOLHDL 4 09/01/2020   A/P: continue to work on lifestyle to improve this- LDL reduction goal at least 30%- prefer to do with lifestyle over meds especially with high CBGs in past and high a1c at 5.9  #Left shoulder pain- almost 12 months now of left shoulder pain- nowgetting some pain up into his neck and has even had a knot in his neck. Doing some exercises to see if this helps. History of psoriasis but no known psoriatic arthritis yet  Recommended follow up:  6 months advised  Lab/Order associations:   ICD-10-CM   1. Essential hypertension  I10     2. COVID-19  U07.1     3. Chronic left shoulder pain  M25.512 Ambulatory referral to Sports Medicine   G89.29      Meds ordered this encounter  Medications   amLODipine (NORVASC) 2.5 MG tablet    Sig: Take 1 tablet (2.5 mg total) by mouth daily.    Dispense:  90 tablet    Refill:  3   I,Harris Phan,acting as a scribe for Garret Reddish, MD.,have documented all relevant documentation on the behalf of Garret Reddish, MD,as directed by  Garret Reddish, MD while in the presence of Garret Reddish, MD.  I, Garret Reddish, MD, have reviewed all documentation for this visit. The documentation on 02/02/21 for the exam, diagnosis, procedures, and orders are all accurate and complete.  Return precautions advised.  Garret Reddish, MD

## 2021-02-18 NOTE — Progress Notes (Signed)
Subjective:    I'm seeing this patient as a consultation for:  Dr. Yong Channel. Note will be routed back to referring provider/PCP.  CC: L shoulder pain  I, Molly Weber, LAT, ATC, am serving as scribe for Dr. Lynne Leader.  HPI: Pt is a 55 y/o male presenting w/ c/o L shoulder pain x approximately one year w/ no known MOI.  He locates his pain to his L ant and post shoulder and post scap.  He describes the pain as a dull ache.  Neck pain: yes into the L lateral neck Radiating pain: into the L lateral neck L shoulder mechanical symptoms: No Aggravating factors: laying on his L side; propping on his L elbow; L shoulder functional IR Treatments tried: Aleve  Past medical history, Surgical history, Family history, Social history, Allergies, and medications have been entered into the medical record, reviewed.   Review of Systems: No new headache, visual changes, nausea, vomiting, diarrhea, constipation, dizziness, abdominal pain, skin rash, fevers, chills, night sweats, weight loss, swollen lymph nodes, body aches, joint swelling, muscle aches, chest pain, shortness of breath, mood changes, visual or auditory hallucinations.   Objective:    Vitals:   02/19/21 0831  BP: 120/78  Pulse: 68  SpO2: 95%   General: Well Developed, well nourished, and in no acute distress.  Neuro/Psych: Alert and oriented x3, extra-ocular muscles intact, able to move all 4 extremities, sensation grossly intact. Skin: Warm and dry, no rashes noted.  Respiratory: Not using accessory muscles, speaking in full sentences, trachea midline.  Cardiovascular: Pulses palpable, no extremity edema. Abdomen: Does not appear distended. MSK: Left shoulder normal. Mildly tender palpation anterior shoulder and trapezius. Normal shoulder motion however painful to internal rotation. Strength intact however painful to abduction and external rotation. Positive Hawkins and Neer's test Negative Yergason's and speeds test. Pulses  cap refill and sensation are intact distally.  Lab and Radiology Results  Diagnostic Limited MSK Ultrasound of: Left shoulder Biceps tendon intact normal-appearing Subscapularis tendon is intact.  Trace hyperechoic change distal tendon insertion consistent with calcific tendinopathy. Supraspinatus tendon is intact normal-appearing Mild subacromial bursitis present. Infraspinatus tendon normal-appearing AC joint mild degenerative appearing Impression: Rotator cuff calcific tendinopathy and subacromial bursitis.   Impression and Recommendations:    Assessment and Plan: 55 y.o. male with multifactorial left shoulder pain.  Patient has rotator cuff tendinopathy and subacromial bursitis as well as periscapular muscle dysfunction and trapezius dysfunction..  Plan to treat with nitroglycerin patch protocol and physical therapy.  Recheck in about 6 weeks.  PDMP not reviewed this encounter. Orders Placed This Encounter  Procedures   [place] LIMITED JOINT SPACE STRUCTURES UP LEFT(NO LINKED CHARGES)    Order Specific Question:   Reason for Exam (SYMPTOM  OR DIAGNOSIS REQUIRED)    Answer:   L shoulder pain    Order Specific Question:   Preferred imaging location?    Answer:   [facility]   Ambulatory referral to Physical Therapy    Referral Priority:   Routine    Referral Type:   Physical Medicine    Referral Reason:   Specialty Services Required    Requested Specialty:   Physical Therapy    Number of Visits Requested:   1   Meds ordered this encounter  Medications   nitroGLYCERIN (NITRODUR - DOSED IN MG/24 HR) 0.2 mg/hr patch    Sig: Apply 1/4 patch daily to tendon for tendonitis.    Dispense:  30 patch    Refill:  1    Discussed warning signs or symptoms. Please see discharge instructions. Patient expresses understanding.   The above documentation has been reviewed and is accurate and complete Lynne Leader, M.D.

## 2021-02-19 ENCOUNTER — Ambulatory Visit: Payer: Self-pay

## 2021-02-19 ENCOUNTER — Ambulatory Visit: Payer: No Typology Code available for payment source | Admitting: Family Medicine

## 2021-02-19 ENCOUNTER — Encounter: Payer: Self-pay | Admitting: Family Medicine

## 2021-02-19 ENCOUNTER — Other Ambulatory Visit: Payer: Self-pay

## 2021-02-19 VITALS — BP 120/78 | HR 68 | Ht 72.0 in | Wt 198.8 lb

## 2021-02-19 DIAGNOSIS — G8929 Other chronic pain: Secondary | ICD-10-CM

## 2021-02-19 DIAGNOSIS — M25512 Pain in left shoulder: Secondary | ICD-10-CM

## 2021-02-19 MED ORDER — NITROGLYCERIN 0.2 MG/HR TD PT24: MEDICATED_PATCH | TRANSDERMAL | 1 refills | Status: DC

## 2021-02-19 NOTE — Patient Instructions (Signed)
Thank you for coming in today.   I've referred you to Physical Therapy.  Let us know if you don't hear from them in one week.   Nitroglycerin Protocol  Apply 1/4 nitroglycerin patch to affected area daily. Change position of patch within the affected area every 24 hours. You may experience a headache during the first 1-2 weeks of using the patch, these should subside. If you experience headaches after beginning nitroglycerin patch treatment, you may take your preferred over the counter pain reliever. Another side effect of the nitroglycerin patch is skin irritation or rash related to patch adhesive. Please notify our office if you develop more severe headaches or rash, and stop the patch. Tendon healing with nitroglycerin patch may require 12 to 24 weeks depending on the extent of injury. Men should not use if taking Viagra, Cialis, or Levitra.  Do not use if you have migraines or rosacea.    Recheck in about 6 weeks.   Let me know if this is not working or you have a problem.   Ok to continue continue to exercise if you do not hurt.

## 2021-03-04 ENCOUNTER — Ambulatory Visit: Payer: No Typology Code available for payment source | Admitting: Physical Therapy

## 2021-03-04 ENCOUNTER — Other Ambulatory Visit: Payer: Self-pay

## 2021-03-04 DIAGNOSIS — G8929 Other chronic pain: Secondary | ICD-10-CM

## 2021-03-04 DIAGNOSIS — M25512 Pain in left shoulder: Secondary | ICD-10-CM | POA: Diagnosis not present

## 2021-03-04 NOTE — Patient Instructions (Signed)
Access Code: [email or web address] URL: [email or web address] Date: 03/04/2021 Prepared by: Lyndee Hensen  Exercises Supine Shoulder External Rotation with Dowel - 1 x daily - 1 sets - 10 reps Sidelying Shoulder ER with Towel and Dumbbell - 1 x daily - 1-2 sets - 10 reps Standing Shoulder Internal Rotation Stretch with Hands Behind Back - 1-2 x daily - 5 reps - 10 hold Standing Row with Anchored Resistance - 1 x daily - 1-2 sets - 10 reps

## 2021-03-07 ENCOUNTER — Encounter: Payer: Self-pay | Admitting: Physical Therapy

## 2021-03-07 NOTE — Therapy (Addendum)
[facility] [address] [place], [place], [address] Phone: [phone]   Fax:  [phone]  Physical Therapy Evaluation  Patient Details  Name: Cody Woodard MRN: [id number] Date of Birth: [birth year] Referring Provider (PT): Lynne Leader   Encounter Date: 03/04/2021   PT End of Session - 03/07/21 1450     Visit Number 1    Number of Visits 12    Date for PT Re-Evaluation 04/15/21    Authorization Type UHC    PT Start Time 7564    PT Stop Time 1515    PT Time Calculation (min) 39 min    Activity Tolerance Patient tolerated treatment well    Behavior During Therapy [facility] for tasks assessed/performed             Past Medical History:  Diagnosis Date   ALLERGIC RHINITIS 11/08/2007   Blood transfusion without reported diagnosis    Gouty arthropathy 01/15/2008   HYPERLIPIDEMIA 11/08/2007   HYPERTENSION, ESSENTIAL, UNCONTROLLED 07/20/2010   Olecranon bursitis of left elbow 02/16/2011   Improved now- seen Dr. Oneida Alar in the past.  Dr. Claudia Desanctis is chronic and has been there for years. It rarely flares or causes him problems. He thinks this was traumatic and not related to his gout    PSORIASIS, SCALP 02/14/2008   SCIATICA, CHRONIC 01/19/2009   TENDINITIS, LEFT ELBOW 03/15/2010    Past Surgical History:  Procedure Laterality Date   CATARACT EXTRACTION, BILATERAL     ear pinning     at age 55   [place]     through window age 55   tendon repari     through window age 55    There were no vitals filed for this visit.    Subjective Assessment - 03/07/21 1447     Subjective Pt states increased pain in L shoulder, for about 1 year. Did have pain"around his shoulder" and did have L neck pain into UT which is now better. No UE tingling, but does state radiating pain into deltoid region. Increased pain:  IR behind the back, laying on it .  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 . Exercise: not at this time. No previous pain of note in  L shoulder .    Limitations Lifting;House hold activities    Patient Stated Goals Decreased pain    Currently in Pain? Yes    Pain Score 2     Pain Location Shoulder    Pain Orientation Left    Pain Descriptors / Indicators Aching    Pain Type Acute pain    Pain Radiating Towards down L arm, into deltoid region    Pain Onset More than a month ago    Pain Frequency Intermittent    Aggravating Factors  increased elevaation, abd, and IR behind back.                [facility] PT Assessment - 03/07/21 0001       Assessment   Medical Diagnosis L shoulder pain    Referring Provider (PT) Lynne Leader    Hand Dominance Right    Prior Therapy no      Precautions   Precautions None      Balance Screen   Has the patient fallen in the past 6 months No      Prior Function   Level of Independence Independent      Cognition   Overall Cognitive Status Within Functional Limits for tasks assessed  ROM / Strength   AROM / PROM / Strength AROM;Strength      AROM   Overall AROM Comments L shoulder: limited IR behind the back with pain. Mild pain at end range of full flexion and abd.      Strength   Overall Strength Comments L : 4/5 pain wtih resisted abd and full can position.      Palpation   Palpation comment Pain in posterior shoulder at supraspinatus,      Special Tests   Other special tests pain in full and empty can positions. painful beind the back IR.                        Objective measurements completed on examination: See above findings.       [facility] Adult PT Treatment/Exercise - 03/07/21 0001       Exercises   Exercises Shoulder      Shoulder Exercises: Supine   External Rotation AAROM;15 reps    External Rotation Limitations cane      Shoulder Exercises: Sidelying   External Rotation 20 reps    External Rotation Weight (lbs) 1      Shoulder Exercises: Standing   Row 20 reps    Theraband Level (Shoulder Row) Level 3 (Green)       Shoulder Exercises: Stretch   Other Shoulder Stretches IR behind the back , 2 hands x 10, 5 sec holds                    PT Education - 03/07/21 1450     Education Details PT POC, Exam findings ,HEP    Person(s) Educated Patient    Methods Explanation;Demonstration;Tactile cues;Verbal cues;Handout    Comprehension Verbalized understanding;Returned demonstration;Verbal cues required;Tactile cues required;Need further instruction              PT Short Term Goals - 03/07/21 1453       PT SHORT TERM GOAL #1   Title Pt to be independent with initial HEP    Time 2    Period Weeks    Status New    Target Date 03/18/21               PT Long Term Goals - 03/07/21 1503       PT LONG TERM GOAL #1   Title Pt to be independent with final HEP    Time 6    Period Weeks    Status New    Target Date 04/15/21      PT LONG TERM GOAL #2   Title Pt to report decresed pain in L shoulder to 0-1/10 with activity.    Time 6    Period Weeks    Status New    Target Date 04/15/21      PT LONG TERM GOAL #3   Title Pt to demo ability for full, painfree ROM with reaching, and behind the back motion,and lifting at least 20 lb,  to improve ability for ADLs. and IADLs.    Time 6    Period Weeks    Status New    Target Date 04/15/21                    Plan - 03/07/21 1506     Clinical Impression Statement Pt presents with primary complaint of increased pain in L shoulder. Pt with pain and tenderness in posterior shoulder, and down L arm, consistent with RTC  tendonitis and impingement. Pt wtih decreased ability for full ROM without pain, as well as inability for reaching, lifting, carrying, and IADLS. Pt to benefit from skilled PT to improve deficits and pain.    Personal Factors and Comorbidities Time since onset of injury/illness/exacerbation    Examination-Activity Limitations Lift;Reach Overhead;Carry    Examination-Participation Restrictions Cleaning;Meal  Prep;Yard Work;Community Activity    Stability/Clinical Decision Making Stable/Uncomplicated    Clinical Decision Making Low    Rehab Potential Good    PT Frequency 2x / week    PT Duration 6 weeks    PT Treatment/Interventions ADLs/Self Care Home Management;Cryotherapy;Electrical Stimulation;DME Instruction;Ultrasound;Moist Heat;Iontophoresis 39m/ml Dexamethasone;Functional mobility training;Therapeutic activities;Therapeutic exercise;Patient/family education;Neuromuscular re-education;Manual techniques;Passive range of motion;Vasopneumatic Device;Taping;Dry needling;Spinal Manipulations;Joint Manipulations    PT Home Exercise Plan [email or web address]   Consulted and Agree with Plan of Care Patient             Patient will benefit from skilled therapeutic intervention in order to improve the following deficits and impairments:  Pain, Increased muscle spasms, Decreased activity tolerance, Decreased range of motion, Decreased strength  Visit Diagnosis: Chronic left shoulder pain     Problem List Patient Active Problem List   Diagnosis Date Noted   Insulin resistance 03/30/2018   Adenomatous colon polyp 04/03/2015   Former smoker 01/21/2015   Essential hypertension 07/20/2010   Chronic gouty arthropathy 02/26/2010   Back pain with radiation 01/19/2009   Psoriasis 02/14/2008   Hyperlipidemia 11/08/2007    LLyndee Hensen PT, DPT 3:13 PM  03/07/21    [place] [address]Phone: [phone]  Fax:  [phone] Name: Cody KohrsMRN: [id number] of Birth: [birth year] PHYSICAL THERAPY DISCHARGE SUMMARY  Visits from Start of Care: 2 Plan: Patient agrees to discharge.  Patient goals were not met. Patient is being discharged due to -returning since last visit .    LLyndee Hensen PT, DPT 11:11 AM  08/11/21

## 2021-03-15 ENCOUNTER — Other Ambulatory Visit: Payer: Self-pay | Admitting: Family Medicine

## 2021-03-16 ENCOUNTER — Encounter: Payer: Self-pay | Admitting: Physical Therapy

## 2021-03-16 ENCOUNTER — Ambulatory Visit: Payer: No Typology Code available for payment source | Admitting: Physical Therapy

## 2021-03-16 ENCOUNTER — Other Ambulatory Visit: Payer: Self-pay

## 2021-03-16 DIAGNOSIS — G8929 Other chronic pain: Secondary | ICD-10-CM

## 2021-03-16 DIAGNOSIS — M25512 Pain in left shoulder: Secondary | ICD-10-CM | POA: Diagnosis not present

## 2021-03-16 NOTE — Therapy (Signed)
[facility] [address] [place], [place], [address] Phone: [phone]   Fax:  [phone]  Physical Therapy Treatment  Patient Details  Name: Cody Woodard MRN: [id number] Date of Birth: August 25, 1965 Referring Provider (PT): Lynne Leader   Encounter Date: 03/16/2021   PT End of Session - 03/16/21 1220     Visit Number 2    Number of Visits 12    Date for PT Re-Evaluation 04/15/21    Authorization Type UHC    PT Start Time 0933    PT Stop Time 1013    PT Time Calculation (min) 40 min    Activity Tolerance Patient tolerated treatment well    Behavior During Therapy [facility] for tasks assessed/performed             Past Medical History:  Diagnosis Date   ALLERGIC RHINITIS 11/08/2007   Blood transfusion without reported diagnosis    Gouty arthropathy 01/15/2008   HYPERLIPIDEMIA 11/08/2007   HYPERTENSION, ESSENTIAL, UNCONTROLLED 07/20/2010   Olecranon bursitis of left elbow 02/16/2011   Improved now- seen Dr. Oneida Alar in the past.  Dr. Claudia Desanctis is chronic and has been there for years. It rarely flares or causes him problems. He thinks this was traumatic and not related to his gout    PSORIASIS, SCALP 02/14/2008   SCIATICA, CHRONIC 01/19/2009   TENDINITIS, LEFT ELBOW 03/15/2010    Past Surgical History:  Procedure Laterality Date   CATARACT EXTRACTION, BILATERAL     ear pinning     at age 37   [place]     through window age 85   tendon repari     through window age 77    There were no vitals filed for this visit.   Subjective Assessment - 03/16/21 1219     Subjective Pt states slighlty better soreness, still having pain with reaching out to side, and movements out away from body.    Patient Stated Goals Decreased pain    Currently in Pain? Yes    Pain Score 2     Pain Location Shoulder    Pain Orientation Left    Pain Descriptors / Indicators Aching    Pain Type Acute pain    Pain Onset More than a month ago    Pain  Frequency Intermittent                               [facility] Adult PT Treatment/Exercise - 03/16/21 0001       Exercises   Exercises Shoulder      Shoulder Exercises: Supine   External Rotation AROM;20 reps    External Rotation Weight (lbs) 2    External Rotation Limitations at 45 and 90 deg x 20 ea;      Shoulder Exercises: Sidelying   External Rotation 20 reps    External Rotation Weight (lbs) 1      Shoulder Exercises: Standing   External Rotation 20 reps    Theraband Level (Shoulder External Rotation) Level 3 (Green)    Internal Rotation 20 reps    Theraband Level (Shoulder Internal Rotation) Level 3 (Green)    Row 20 reps    Theraband Level (Shoulder Row) Level 4 (Blue)    Other Standing Exercises Wall push ups x 20;      Shoulder Exercises: Pulleys   Flexion 2 minutes      Shoulder Exercises: Stretch   Other Shoulder Stretches IR behind  the back , 2 hands x 10, 5 sec holds, stick for extension x10;      Manual Therapy   Manual Therapy Joint mobilization;Passive ROM    Joint Mobilization Post and inf GHJ mobs gr 3;    Passive ROM L shoulder, all motions                      PT Short Term Goals - 03/07/21 1453       PT SHORT TERM GOAL #1   Title Pt to be independent with initial HEP    Time 2    Period Weeks    Status New    Target Date 03/18/21               PT Long Term Goals - 03/07/21 1503       PT LONG TERM GOAL #1   Title Pt to be independent with final HEP    Time 6    Period Weeks    Status New    Target Date 04/15/21      PT LONG TERM GOAL #2   Title Pt to report decresed pain in L shoulder to 0-1/10 with activity.    Time 6    Period Weeks    Status New    Target Date 04/15/21      PT LONG TERM GOAL #3   Title Pt to demo ability for full, painfree ROM with reaching, and behind the back motion,and lifting at least 20 lb,  to improve ability for ADLs. and IADLs.    Time 6    Period Weeks    Status  New    Target Date 04/15/21                   Plan - 03/16/21 1221     Clinical Impression Statement Pt with improved ROM for behind the back IR, still soreness at end of available range, but range near WNL. He does have pain at end range of ER, more at neutral positions 0 and 45, better at 90 deg. Able to perform strengthening for rotation today, with fatigue, but no pain. Plan to progress as tolerated. Pt will work on [employer] while he is away, [place] of [place].    Personal Factors and Comorbidities Time since onset of injury/illness/exacerbation    Examination-Activity Limitations Lift;Reach Overhead;Carry    Examination-Participation Restrictions Cleaning;Meal Prep;Yard Work;Community Activity    Stability/Clinical Decision Making Stable/Uncomplicated    Rehab Potential Good    PT Frequency 2x / week    PT Duration 6 weeks    PT Treatment/Interventions ADLs/Self Care Home Management;Cryotherapy;Electrical Stimulation;DME Instruction;Ultrasound;Moist Heat;Iontophoresis '4mg'$ /ml Dexamethasone;Functional mobility training;Therapeutic activities;Therapeutic exercise;Patient/family education;Neuromuscular re-education;Manual techniques;Passive range of motion;Vasopneumatic Device;Taping;Dry needling;Spinal Manipulations;Joint Manipulations    PT Home Exercise Plan [id number]    Consulted and Agree with Plan of Care Patient             Patient will benefit from skilled therapeutic intervention in order to improve the following deficits and impairments:  Pain, Increased muscle spasms, Decreased activity tolerance, Decreased range of motion, Decreased strength  Visit Diagnosis: Chronic left shoulder pain     Problem List Patient Active Problem List   Diagnosis Date Noted   Insulin resistance 03/30/2018   Adenomatous colon polyp 04/03/2015   Former smoker 01/21/2015   Essential hypertension 07/20/2010   Chronic gouty arthropathy 02/26/2010   Back pain with radiation  01/19/2009   Psoriasis 02/14/2008  Hyperlipidemia 11/08/2007     Lyndee Hensen, PT, DPT 12:23 PM  03/16/21   [facility] [place], [place], [address] Phone: [phone]   Fax:  [phone]  Name: Cody Woodard MRN: [id number] Date of Birth: May 10, 1966

## 2021-04-02 ENCOUNTER — Ambulatory Visit: Payer: No Typology Code available for payment source | Admitting: Family Medicine

## 2021-04-08 ENCOUNTER — Encounter: Payer: No Typology Code available for payment source | Admitting: Physical Therapy

## 2021-10-27 ENCOUNTER — Other Ambulatory Visit: Payer: Self-pay | Admitting: Family Medicine

## 2022-01-25 ENCOUNTER — Other Ambulatory Visit: Payer: Self-pay | Admitting: Family Medicine

## 2022-03-02 ENCOUNTER — Telehealth: Payer: Self-pay | Admitting: Family Medicine

## 2022-03-03 ENCOUNTER — Ambulatory Visit: Payer: No Typology Code available for payment source | Admitting: Family

## 2022-03-03 ENCOUNTER — Other Ambulatory Visit: Payer: Self-pay

## 2022-03-03 MED ORDER — AMLODIPINE BESYLATE 2.5 MG PO TABS: 2.5000 mg | ORAL_TABLET | Freq: Every day | ORAL | 0 refills | Status: DC

## 2022-03-03 NOTE — Telephone Encounter (Signed)
#  12 sent to pharmacy to get patient to appointment.

## 2022-03-03 NOTE — Telephone Encounter (Signed)
It has been over a year since patient has been seen and no f/u or CPE has been scheduled. Please schedule OV for pt in order for further refills, let me know once scheduled please.

## 2022-03-03 NOTE — Telephone Encounter (Signed)
Patient states that his refill for norvasc 2.5 mg had been denied and he is unsure why. Patient has not been seen by PCP since 02/02/21. Patient requests a refill of his medication with an OV? Will patient need an OV/VV or can the refill be sent in.    .. Encourage patient to contact the pharmacy for refills or they can request refills through [facility]:  02/02/21  NEXT APPOINTMENT DATE: None  MEDICATION: amLODipine (NORVASC) 2.5 MG tablet   Is the patient out of medication? Yes  PHARMACY: [facility] [id number] [place], [place] [address]  Phone:  [phone]  Fax:  [phone]   Let patient know to contact pharmacy at the end of the day to make sure medication is ready.  Please notify patient to allow 48-72 hours to process

## 2022-03-15 ENCOUNTER — Ambulatory Visit (INDEPENDENT_AMBULATORY_CARE_PROVIDER_SITE_OTHER): Payer: No Typology Code available for payment source | Admitting: Family Medicine

## 2022-03-15 ENCOUNTER — Encounter: Payer: Self-pay | Admitting: Family Medicine

## 2022-03-15 VITALS — BP 154/72 | HR 67 | Temp 98.1°F | Ht 72.0 in | Wt 197.6 lb

## 2022-03-15 DIAGNOSIS — Z23 Encounter for immunization: Secondary | ICD-10-CM

## 2022-03-15 DIAGNOSIS — M1A00X0 Idiopathic chronic gout, unspecified site, without tophus (tophi): Secondary | ICD-10-CM | POA: Diagnosis not present

## 2022-03-15 DIAGNOSIS — E8881 Metabolic syndrome: Secondary | ICD-10-CM

## 2022-03-15 DIAGNOSIS — Z125 Encounter for screening for malignant neoplasm of prostate: Secondary | ICD-10-CM

## 2022-03-15 DIAGNOSIS — E785 Hyperlipidemia, unspecified: Secondary | ICD-10-CM

## 2022-03-15 DIAGNOSIS — Z0000 Encounter for general adult medical examination without abnormal findings: Secondary | ICD-10-CM | POA: Diagnosis not present

## 2022-03-15 DIAGNOSIS — Z1211 Encounter for screening for malignant neoplasm of colon: Secondary | ICD-10-CM

## 2022-03-15 LAB — PSA: PSA: 1.07 ng/mL (ref 0.10–4.00)

## 2022-03-15 LAB — LIPID PANEL
Cholesterol: 195 mg/dL (ref 0–200)
HDL: 52.5 mg/dL (ref >39.00)
NonHDL: 142.76
Total CHOL/HDL Ratio: 4
Triglycerides: 231 mg/dL — ABNORMAL HIGH (ref 0.0–149.0)
VLDL: 46.2 mg/dL — ABNORMAL HIGH (ref 0.0–40.0)

## 2022-03-15 LAB — CBC WITH DIFFERENTIAL/PLATELET
Basophils Absolute: 0 10*3/uL (ref 0.0–0.1)
Basophils Relative: 0.5 % (ref 0.0–3.0)
Eosinophils Absolute: 0.2 10*3/uL (ref 0.0–0.7)
Eosinophils Relative: 4.1 % (ref 0.0–5.0)
HCT: 40.4 % (ref 39.0–52.0)
Hemoglobin: 13.9 g/dL (ref 13.0–17.0)
Lymphocytes Relative: 24.7 % (ref 12.0–46.0)
Lymphs Abs: 1.4 10*3/uL (ref 0.7–4.0)
MCHC: 34.4 g/dL (ref 30.0–36.0)
MCV: 96.2 fl (ref 78.0–100.0)
Monocytes Absolute: 0.6 10*3/uL (ref 0.1–1.0)
Monocytes Relative: 11.6 % (ref 3.0–12.0)
Neutro Abs: 3.3 10*3/uL (ref 1.4–7.7)
Neutrophils Relative %: 59.1 % (ref 43.0–77.0)
Platelets: 196 10*3/uL (ref 150.0–400.0)
RBC: 4.2 Mil/uL — ABNORMAL LOW (ref 4.22–5.81)
RDW: 12.8 % (ref 11.5–15.5)
WBC: 5.6 10*3/uL (ref 4.0–10.5)

## 2022-03-15 LAB — COMPREHENSIVE METABOLIC PANEL
ALT: 27 U/L (ref 0–53)
AST: 23 U/L (ref 0–37)
Albumin: 4.6 g/dL (ref 3.5–5.2)
Alkaline Phosphatase: 48 U/L (ref 39–117)
BUN: 17 mg/dL (ref 6–23)
CO2: 28 mEq/L (ref 19–32)
Calcium: 9.9 mg/dL (ref 8.4–10.5)
Chloride: 101 mEq/L (ref 96–112)
Creatinine, Ser: 0.93 mg/dL (ref 0.40–1.50)
GFR: 91.1 mL/min (ref >60.00)
Glucose, Bld: 105 mg/dL — ABNORMAL HIGH (ref 70–99)
Potassium: 4 mEq/L (ref 3.5–5.1)
Sodium: 139 mEq/L (ref 135–145)
Total Bilirubin: 0.7 mg/dL (ref 0.2–1.2)
Total Protein: 7.1 g/dL (ref 6.0–8.3)

## 2022-03-15 LAB — HEMOGLOBIN A1C: Hgb A1c MFr Bld: 6 % (ref 4.6–6.5)

## 2022-03-15 LAB — LDL CHOLESTEROL, DIRECT: Direct LDL: 97 mg/dL

## 2022-03-15 MED ORDER — SIMVASTATIN 40 MG PO TABS: ORAL_TABLET | ORAL | 3 refills | Status: DC

## 2022-03-15 MED ORDER — FEBUXOSTAT 40 MG PO TABS: 40.0000 mg | ORAL_TABLET | Freq: Every day | ORAL | 3 refills | Status: DC

## 2022-03-15 MED ORDER — AMLODIPINE BESYLATE 2.5 MG PO TABS: 2.5000 mg | ORAL_TABLET | Freq: Every day | ORAL | 3 refills | Status: DC

## 2022-03-15 MED ORDER — TRIAMCINOLONE ACETONIDE 0.1 % EX OINT: 1.0000 | TOPICAL_OINTMENT | Freq: Two times a day (BID) | CUTANEOUS | 5 refills | Status: DC

## 2022-03-15 MED ORDER — BISOPROLOL-HYDROCHLOROTHIAZIDE 5-6.25 MG PO TABS: 1.0000 | ORAL_TABLET | Freq: Every day | ORAL | 3 refills | Status: DC

## 2022-03-15 NOTE — Addendum Note (Signed)
Addended by: Clyde Lundborg A on: 03/15/2022 02:52 PM   Modules accepted: Orders

## 2022-03-15 NOTE — Progress Notes (Signed)
Phone: [phone]   Subjective:  Patient presents today for their annual physical. Chief complaint-noted.   See problem oriented charting- Review of Systems  Constitutional:  Negative for chills and fever.  HENT:  Negative for congestion and nosebleeds.   Eyes:  Negative for blurred vision and double vision.  Respiratory:  Negative for cough and shortness of breath.   Cardiovascular:  Negative for chest pain and palpitations.  Gastrointestinal:  Positive for nausea (in the on amoxicillin with dentist). Negative for abdominal pain, blood in stool, constipation, diarrhea, heartburn, melena and vomiting.  Genitourinary:  Negative for dysuria and frequency.  Musculoskeletal:  Positive for back pain (stable baseline mild). Negative for myalgias.  Skin:  Negative for itching and rash.  Neurological:  Positive for dizziness (more vertigo- does exercises and resolves. head movement). Negative for headaches.  Endo/Heme/Allergies:  Negative for polydipsia. Does not bruise/bleed easily.  Psychiatric/Behavioral:  Negative for depression and suicidal ideas.    The following were reviewed and entered/updated in epic: Past Medical History:  Diagnosis Date   ALLERGIC RHINITIS 11/08/2007   Blood transfusion without reported diagnosis    Gouty arthropathy 01/15/2008   HYPERLIPIDEMIA 11/08/2007   HYPERTENSION, ESSENTIAL, UNCONTROLLED 07/20/2010   Olecranon bursitis of left elbow 02/16/2011   Improved now- seen Dr. Oneida Alar in the past.  Dr. Claudia Desanctis is chronic and has been there for years. It rarely flares or causes him problems. He thinks this was traumatic and not related to his gout    PSORIASIS, SCALP 02/14/2008   SCIATICA, CHRONIC 01/19/2009   TENDINITIS, LEFT ELBOW 03/15/2010   Patient Active Problem List   Diagnosis Date Noted   Essential hypertension 07/20/2010    Priority: Medium    Chronic gouty arthropathy 02/26/2010    Priority: Medium    Hyperlipidemia 11/08/2007    Priority: Medium     Insulin resistance 03/30/2018    Priority: Low   Adenomatous colon polyp 04/03/2015    Priority: Low   Former smoker 01/21/2015    Priority: Low   Back pain with radiation 01/19/2009    Priority: Low   Psoriasis 02/14/2008    Priority: Low   Past Surgical History:  Procedure Laterality Date   CATARACT EXTRACTION, BILATERAL     ear pinning     at age 75   LACERATION REPAIR     through window age 83   tendon repari     through window age 64    Family History  Adopted: Yes    Medications- reviewed and updated Current Outpatient Medications  Medication Sig Dispense Refill   betamethasone, augmented, (DIPROLENE) 0.05 % lotion      clobetasol (TEMOVATE) 0.05 % external solution APPLY ONTO THE SKIN/SCALP DAILY AS DIRECTED BY DOCTOR **MAX QTY 25 PER INS**     Colchicine (MITIGARE) 0.6 MG CAPS Take 2 pills at first sign of gout flare, then take 1 pill 2 hours later if pain persists. 1 pill daily thereafter until flare resolves 30 capsule 5   naproxen sodium (ANAPROX) 220 MG tablet Take 220 mg by mouth as needed.     triamcinolone ointment (KENALOG) 0.1 % Apply 1 Application topically 2 (two) times daily. For psoriasis (avoid on the face) 80 g 5   amLODipine (NORVASC) 2.5 MG tablet Take 1 tablet (2.5 mg total) by mouth daily. 90 tablet 3   bisoprolol-hydrochlorothiazide (ZIAC) 5-6.25 MG tablet Take 1 tablet by mouth daily. 90 tablet 3   febuxostat (ULORIC) 40 MG tablet Take 1 tablet (40 mg  total) by mouth daily. 90 tablet 3   simvastatin (ZOCOR) 40 MG tablet TAKE ONE TABLET BY MOUTH ONE TIME DAILY. 90 tablet 3   No current facility-administered medications for this visit.    Allergies-reviewed and updated No Known Allergies  Social History   Social History Narrative   Married ( wife patient here as well). Son in [place] 35 - 2 grandkids and 1 on the way in 2023.       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 Oropharynx normal Neck: no thyromegaly CV: RRR no murmurs rubs or gallops Lungs: CTAB no crackles, wheeze, rhonchi Abdomen: soft/nontender/nondistended/normal bowel sounds. No rebound or guarding.  Ext: no edema Skin: warm, dry Neuro: grossly normal, moves all extremities, PERRLA    Assessment and Plan  56 y.o. male presenting for annual physical.  Health Maintenance counseling: 1. Anticipatory guidance: Patient counseled regarding regular dental exams - getting plugged back into q6 months, eye exams -yearly,  avoiding smoking and second hand smoke , limiting alcohol to 2 beverages per day - 14 a week, no illicit drugs .   2. Risk factor reduction:  Advised patient of need for regular exercise and diet rich and fruits and vegetables to reduce risk of heart attack and stroke.  Exercise- 5 days a week - free weights, core, push ups, plyometrics- also gets 10k steps most days still.  Diet/weight management-stable weight- would like to drop a few lbs.  Wt Readings from Last 3 Encounters:  03/15/22 197 lb 9.6 oz (89.6 kg)  02/19/21 198 lb 12.8 oz (90.2 kg)  02/02/21 197 lb (89.4 kg)  3. Immunizations/screenings/ancillary studies-offer Tdap today, discussed flu shot and COVID-19 vaccination perhaps in October  Immunization History  Administered Date(s) Administered   Influenza Split 06/13/2012   Influenza,inj,Quad PF,6+ Mos 04/28/2016, 07/10/2017, 03/30/2018, 04/04/2019, 07/10/2020   PFIZER(Purple Top)SARS-COV-2 Vaccination 10/24/2019, 11/18/2019, 07/10/2020   Td 01/06/2001   Tdap 03/11/2011   Zoster Recombinat (Shingrix) 10/01/2018, 03/07/2019  4. Prostate cancer screening- low risk prior PSA trend-update with labs. Wants to defer rectal for now Lab Results  Component Value Date    PSA 1.11 04/04/2019   PSA 1.02 03/30/2018   PSA 1.13 04/21/2016   5. Colon cancer screening - August 2016 with history of colon polyp and again referred today- some concern about cost  6. Skin cancer screening-follows with Dr. Ronnald Ramp as needed. advised regular sunscreen use. Denies worrisome, changing, or new skin lesions- stable psoriasis 7. Smoking associated screening (lung cancer screening, AAA screen 65-75, UA)-former smoker- quit smoking in 2009-AAA scan planned at 6.  23 pack years-discussed newer guidelines for lung cancer screening- opts out.  Also declines urine.  8. STD screening - only active with wife  Status of chronic or acute concerns   #social update- lost both parents last year (adopted though)- very hard on him  #Hypertension S: Compliant with Ziac 5-6.25 mg. BP Readings from Last 3 Encounters:  03/15/22 (!) 154/72  02/19/21 120/78  02/02/21 (!) 147/95   A/P: blood pressure slightly high but some frustration with healthcare cost discussion today- he agrees to do some home monitoring for [place] and  update [place] before the end of the week on mychart  -monitor bp if takes naproxen - has been in 130s at dentist when not in [place]- suspect controlled at home- for now continue current meds  #Gout S: Compliant with Uloric 40 mg= doing well on this even every other day  Uses colchicine as needed.  Uric acid level goal under 6.  Aware of risks increased on hydrochlorothiazide.  Never on allopurinol. Lab Results  Component Value Date   LABURIC 7.3 09/01/2020   A/P: offered uric acid but to lower cost and fact he is not having flares- we opted to hold off- could change decision in future if has flares    #Hyperlipidemia S: Compliant with simvastatin 40 mg Lab Results  Component Value Date   CHOL 218 (H) 09/01/2020   HDL 50.30 09/01/2020   LDLCALC 134 (H) 09/01/2020   LDLDIRECT 115.5 07/11/2011   TRIG 167.0 (H) 09/01/2020   CHOLHDL 4 09/01/2020  A/P: hopefully stable or  improved- update lipid panel today. Continue current meds for now  #Insulin resistance/hyperglycemia S: Fasting CBG slightly elevated Lab Results  Component Value Date   HGBA1C 5.9 09/01/2020   HGBA1C 5.9 04/04/2019  A/P: hopefully stable- update a1c today. Continue current meds for now     #Psoriasis S: Uses as needed steroid cream from dermatology.  Has seen rheumatology in the past- [place] arthritis thought to be OA as expected and not psoriatic arthritis  -does better in summer A/P: reasonably stable- continue current meds   Recommended follow up: Return in about 6 months (around 09/15/2022) for followup or sooner if needed.Schedule b4 you leave.  Lab/Order associations:NOT fasting- bread and butter 7 am and cheese at 12 slice   [id number]   1. Preventative health care  Z00.00 CBC with Differential/Platelet    Comprehensive metabolic panel    Lipid panel    PSA    HgB A1c    2. Hyperlipidemia, unspecified hyperlipidemia type  E78.5 CBC with Differential/Platelet    Comprehensive metabolic panel    Lipid panel    3. Chronic gouty arthropathy  M1A.00X0     4. Insulin resistance  E88.81 HgB A1c    5. Screening for prostate cancer  Z12.5 PSA    6. Screen for colon cancer  Z12.11 Ambulatory referral to Gastroenterology      Meds ordered this encounter  Medications   simvastatin (ZOCOR) 40 MG tablet    Sig: TAKE ONE TABLET BY MOUTH ONE TIME DAILY.    Dispense:  90 tablet    Refill:  3   febuxostat (ULORIC) 40 MG tablet    Sig: Take 1 tablet (40 mg total) by mouth daily.    Dispense:  90 tablet    Refill:  3   amLODipine (NORVASC) 2.5 MG tablet    Sig: Take 1 tablet (2.5 mg total) by mouth daily.    Dispense:  90 tablet    Refill:  3   triamcinolone ointment (KENALOG) 0.1 %    Sig: Apply 1 Application topically 2 (two) times daily. For psoriasis (avoid on the face)    Dispense:  80 g    Refill:  5   bisoprolol-hydrochlorothiazide (ZIAC) 5-6.25 MG tablet    Sig: Take 1  tablet by mouth daily.    Dispense:  90 tablet    Refill:  3    Return precautions advised.  Garret Reddish, MD

## 2022-03-15 NOTE — Patient Instructions (Addendum)
Health Maintenance Due  Topic Date Due   COLONOSCOPY   Overdue- please call to schedule  [facility] contact Please call to schedule visit and/or procedure Address: [address], [place], [place] [address] Phone: [phone]  03/25/2020   TETANUS/TDAP-recommended today 03/10/2021   INFLUENZA VACCINE  Flu shot- we should have these available within a month or two but please let us know if you get at outside pharmacy -also can consider covid shot in october 03/08/2022    Please stop by lab before you go If you have mychart- we will send your results within 3 business days of [place] receiving them.  If you do not have mychart- we will call you about results within 5 business days of [place] receiving them.  *please also note that you will see labs on mychart as soon as they post. I will later go in and write notes on them- will say "notes from Dr. Yong Channel"   blood pressure slightly high but some frustration with healthcare cost discussion today- he agrees to do some home monitoring for [place] and update [place] before the end of the week on mychart  -for now continue current meds  Recommended follow up: Return in about 6 months (around 09/15/2022) for followup or sooner if needed.Schedule b4 you leave.

## 2022-03-22 ENCOUNTER — Encounter: Payer: Self-pay | Admitting: Family Medicine

## 2022-05-02 ENCOUNTER — Encounter: Payer: Self-pay | Admitting: *Deleted

## 2022-07-21 ENCOUNTER — Encounter: Payer: Self-pay | Admitting: *Deleted

## 2022-12-21 ENCOUNTER — Encounter: Payer: Self-pay | Admitting: Family Medicine

## 2022-12-21 ENCOUNTER — Other Ambulatory Visit: Payer: Self-pay

## 2022-12-21 MED ORDER — AMLODIPINE BESYLATE 5 MG PO TABS: 5.0000 mg | ORAL_TABLET | Freq: Every day | ORAL | 2 refills | Status: DC

## 2022-12-21 NOTE — Telephone Encounter (Signed)
See below regarding weight, do you want to see pt in office or can this question be answered here? I have sent in 5mg  script.

## 2023-03-17 ENCOUNTER — Encounter: Payer: 59 | Admitting: Family Medicine

## 2023-04-07 ENCOUNTER — Ambulatory Visit (INDEPENDENT_AMBULATORY_CARE_PROVIDER_SITE_OTHER): Payer: 59 | Admitting: Family Medicine

## 2023-04-07 ENCOUNTER — Encounter: Payer: Self-pay | Admitting: Family Medicine

## 2023-04-07 VITALS — BP 144/84 | HR 63 | Temp 97.8°F | Ht 72.0 in | Wt 204.8 lb

## 2023-04-07 DIAGNOSIS — Z0000 Encounter for general adult medical examination without abnormal findings: Secondary | ICD-10-CM | POA: Diagnosis not present

## 2023-04-07 DIAGNOSIS — R5383 Other fatigue: Secondary | ICD-10-CM

## 2023-04-07 DIAGNOSIS — Z125 Encounter for screening for malignant neoplasm of prostate: Secondary | ICD-10-CM

## 2023-04-07 DIAGNOSIS — I10 Essential (primary) hypertension: Secondary | ICD-10-CM | POA: Diagnosis not present

## 2023-04-07 DIAGNOSIS — Z87891 Personal history of nicotine dependence: Secondary | ICD-10-CM

## 2023-04-07 DIAGNOSIS — E88819 Insulin resistance, unspecified: Secondary | ICD-10-CM

## 2023-04-07 DIAGNOSIS — Z131 Encounter for screening for diabetes mellitus: Secondary | ICD-10-CM

## 2023-04-07 DIAGNOSIS — E785 Hyperlipidemia, unspecified: Secondary | ICD-10-CM

## 2023-04-07 DIAGNOSIS — M1A00X0 Idiopathic chronic gout, unspecified site, without tophus (tophi): Secondary | ICD-10-CM

## 2023-04-07 DIAGNOSIS — Z23 Encounter for immunization: Secondary | ICD-10-CM

## 2023-04-07 DIAGNOSIS — D126 Benign neoplasm of colon, unspecified: Secondary | ICD-10-CM

## 2023-04-07 MED ORDER — AMLODIPINE BESYLATE 10 MG PO TABS: 10.0000 mg | ORAL_TABLET | Freq: Every day | ORAL | 3 refills | Status: AC

## 2023-04-07 MED ORDER — ATORVASTATIN CALCIUM 20 MG PO TABS: 20.0000 mg | ORAL_TABLET | Freq: Every day | ORAL | 3 refills | Status: AC

## 2023-04-07 NOTE — Patient Instructions (Signed)
[facility] contact Please call to schedule visit and/or procedure Address: [address] [place], [place], [place] [address] Phone: [phone]   Let us know if you get a COVID vaccine this fall.  Schedule a lab visit at the check out desk within 2 weeks. Return for future fasting labs meaning nothing but water after midnight please. Ok to take your medications with water.   -since we are changing amlodipine dose- we will change to atorvastatin 20 mg to accommodate increasing the amlodipine  - finish simvastatin 40 mg -after you finish increase amlodipine to 10 mg (can take two of the 5 mg tablets) - after you finish simvastatin switch to atorvastatin 20 mg daily -update me with home blood pressure 3 weeks after change or so- and we may tweak medications at that point  Recommended follow up: Return in about 6 months (around 10/06/2023) for followup or sooner if needed.Schedule b4 you leave.  But at absolute minimum once a year

## 2023-04-07 NOTE — Progress Notes (Signed)
Phone: [phone]   Subjective:  Patient presents today for their annual physical. Chief complaint-noted.   See problem oriented charting- ROS- full  review of systems was completed and negative  Per full ROS sheet completed by patient- mild increased fatigue- feels mental fatigue  The following were reviewed and entered/updated in epic: Past Medical History:  Diagnosis Date   ALLERGIC RHINITIS 11/08/2007   Blood transfusion without reported diagnosis    Gouty arthropathy 01/15/2008   HYPERLIPIDEMIA 11/08/2007   HYPERTENSION, ESSENTIAL, UNCONTROLLED 07/20/2010   Olecranon bursitis of left elbow 02/16/2011   Improved now- seen Dr. Darrick Penna in the past.  Dr. Anette Riedel is chronic and has been there for years. It rarely flares or causes him problems. He thinks this was traumatic and not related to his gout    PSORIASIS, SCALP 02/14/2008   SCIATICA, CHRONIC 01/19/2009   TENDINITIS, LEFT ELBOW 03/15/2010   Patient Active Problem List   Diagnosis Date Noted   Essential hypertension 07/20/2010    Priority: Medium    Chronic gouty arthropathy 02/26/2010    Priority: Medium    Hyperlipidemia 11/08/2007    Priority: Medium    Insulin resistance 03/30/2018    Priority: Low   Adenomatous colon polyp 04/03/2015    Priority: Low   Former smoker 01/21/2015    Priority: Low   Back pain with radiation 01/19/2009    Priority: Low   Psoriasis 02/14/2008    Priority: Low   Past Surgical History:  Procedure Laterality Date   CATARACT EXTRACTION, BILATERAL     ear pinning     at age 69   EYE SURGERY  2021   Cateract Surgery both eyes   LACERATION REPAIR     through window age 23   tendon repari     through window age 39    Family History  Adopted: Yes   Medications- reviewed and updated Current Outpatient Medications  Medication Sig Dispense Refill   amLODipine (NORVASC) 5 MG tablet Take 1 tablet (5 mg total) by mouth daily. 90 tablet 2   betamethasone, augmented, (DIPROLENE) 0.05 %  lotion      bisoprolol-hydrochlorothiazide (ZIAC) 5-6.25 MG tablet Take 1 tablet by mouth daily. 90 tablet 3   clobetasol (TEMOVATE) 0.05 % external solution APPLY ONTO THE SKIN/SCALP DAILY AS DIRECTED BY DOCTOR **MAX QTY 25 PER INS**     Colchicine (MITIGARE) 0.6 MG CAPS Take 2 pills at first sign of gout flare, then take 1 pill 2 hours later if pain persists. 1 pill daily thereafter until flare resolves 30 capsule 5   febuxostat (ULORIC) 40 MG tablet Take 1 tablet (40 mg total) by mouth daily. 90 tablet 3   naproxen sodium (ANAPROX) 220 MG tablet Take 220 mg by mouth as needed.     simvastatin (ZOCOR) 40 MG tablet TAKE ONE TABLET BY MOUTH ONE TIME DAILY. 90 tablet 3   triamcinolone ointment (KENALOG) 0.1 % Apply 1 Application topically 2 (two) times daily. For psoriasis (avoid on the face) 80 g 5   No current facility-administered medications for this visit.    Allergies-reviewed and updated No Known Allergies  Social History   Social History Narrative   Married ( wife patient here as well). Son in [place] 35 - 2 grandkids and 1 on the way in 2023.       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 Oropharynx normal Neck: no thyromegaly CV: RRR no murmurs rubs or gallops Lungs: CTAB no crackles, wheeze, rhonchi Abdomen: soft/nontender/nondistended/normal bowel sounds. No rebound or guarding.  Ext: minimal edema Skin: warm, dry Neuro: grossly normal, moves all extremities, PERRLA    Assessment and Plan  57 y.o. male presenting for annual physical.  Health Maintenance counseling: 1. Anticipatory guidance: Patient counseled regarding regular dental exams - q6 months- costly interventions working through, eye exams  -yearly,  avoiding smoking and second hand smoke, limiting alcohol to 2 beverages per day -14 a week last year reported  upt o 21- advised for weight loss and liver health to reduce back to 14, no illicit drugs .   2. Risk factor reduction:  Advised patient of need for regular exercise and diet rich and fruits and vegetables to reduce risk of heart attack and stroke.  Exercise- 4-5 days a week free weights, core, push ups, plymometric and gets 10000 steps most days still.  Diet/weight management-weight up 7 lbs- wants to reverse trend- feels can easily reverse this with dietary change.  Wt Readings from Last 3 Encounters:  04/07/23 204 lb 12.8 oz (92.9 kg)  03/15/22 197 lb 9.6 oz (89.6 kg)  02/19/21 198 lb 12.8 oz (90.2 kg)  3. Immunizations/screenings/ancillary studies- flu shot today, considering fall COVID shot  Immunization History  Administered Date(s) Administered   Influenza Split 06/13/2012   Influenza, Seasonal, Injecte, Preservative Fre 04/07/2023   Influenza,inj,Quad PF,6+ Mos 04/28/2016, 07/10/2017, 03/30/2018, 04/04/2019, 07/10/2020   PFIZER(Purple Top)SARS-COV-2 Vaccination 10/24/2019, 11/18/2019, 07/10/2020   Td 01/06/2001   Tdap 03/11/2011, 03/15/2022   Zoster Recombinant(Shingrix) 10/01/2018, 03/07/2019  4. Prostate cancer screening-  low risk prior PSA trend- monitor today Lab Results  Component Value Date   PSA 1.07 03/15/2022   PSA 1.11 04/04/2019   PSA 1.02 03/30/2018   5. Colon cancer screening -  August 2016 with history of polyps- referred last year but did not follow through- he has concern about cost- encouraged him to chat with Insurance 6. Skin cancer screening- Dr. Yetta Barre as needed. advised regular sunscreen use. Denies worrisome, changing, or new skin lesions.  7. Smoking associated screening (lung cancer screening, AAA screen 65-75, UA)- former smoker- quit 2009. Abdominal aortic aneurysm planned 64. 23 pack years- opts in this year for lung cancer screen  (concern for cost). Opts in urine 8. STD screening - only active with wife  Status of chronic or acute concerns   #social update- last year he lost his adoptive parents and was very hard on him- reports now doing better   #some fatigue but no low libido- interested in testosterone and thyroid testing #Hypertension S: Compliant with Ziac 5-6.25 mg, amlodipine 5 mg - home readings usually around 140/90. 130s when hand implants BP Readings from Last 3 Encounters:  04/07/23 (!) 144/84  03/15/22 (!) 154/72  02/19/21 120/78  A/P: blood pressure above goal- increase amlodipine to 10 mg   #Gout S: Compliant with Uloric 40 mg even if just every other day.  Uses colchicine as needed.  Uric acid level goal under 6.  Aware of risks increased on hydrochlorothiazide.  Never on allopurinol. Lab Results  Component Value Date   LABURIC 7.3 09/01/2020  A/P: with no flare ups we have opted to  not check uric acid - continue current medications     #Hyperlipidemia S: Compliant with simvastatin 40 mg Lab Results  Component Value Date   CHOL 195 03/15/2022   HDL 52.50 03/15/2022   LDLCALC 134 (H) 09/01/2020   LDLDIRECT 97.0 03/15/2022   TRIG 231.0 (H) 03/15/2022   CHOLHDL 4 03/15/2022   A/P: reasonable but imperfect control- can work on healthy eating and regular exercise to bring down over increasing medicine plus going up on statin strength could increase prediabetes risk -since we are changing amlodipine dose- we will change to atorvastatin 20 mg to accommodate increasing the amlodipine  #Insulin resistance/hyperglycemia S: Fasting CBG slightly elevated Lab Results  Component Value Date   HGBA1C 6.0 03/15/2022   HGBA1C 5.9 09/01/2020   HGBA1C 5.9 04/04/2019  A/P: hopefully stable- update a1c today. Continue without meds for now    #Psoriasis S: Uses as needed steroid cream from dermatology.  Has seen rheumatology in the past- [facility] arthritis thought to be OA as expected and not psoriatic  arthritis  A/P: overall stable- continue current medications    Recommended follow up: No follow-ups on file.  Lab/Order associations:NOT fasting   ICD-10-CM   1. Preventative health care  Z00.00     2. Encounter for immunization  Z23 Flu vaccine trivalent PF, 6mos and older(Flulaval,Afluria,Fluarix,Fluzone)    3. Essential hypertension  I10     4. Hyperlipidemia, unspecified hyperlipidemia type  E78.5 Comprehensive metabolic panel    CBC with Differential/Platelet    Lipid panel    5. Chronic gouty arthropathy  M1A.00X0     6. Adenomatous polyp of colon, unspecified part of colon  D12.6 Ambulatory referral to Gastroenterology    7. Former smoker  Z87.891 CANCELED: Ambulatory Referral for Lung Cancer Scre    8. Insulin resistance  E88.819 Hemoglobin A1c    9. Screening for diabetes mellitus  Z13.1 Hemoglobin A1c    10. Screening for prostate cancer  Z12.5 PSA    11. Fatigue, unspecified type  R53.83 Testosterone    TSH      No orders of the defined types were placed in this encounter.   Return precautions advised.  Tana Conch, MD

## 2023-04-12 ENCOUNTER — Other Ambulatory Visit: Payer: 59

## 2023-04-12 DIAGNOSIS — Z125 Encounter for screening for malignant neoplasm of prostate: Secondary | ICD-10-CM

## 2023-04-12 DIAGNOSIS — E785 Hyperlipidemia, unspecified: Secondary | ICD-10-CM | POA: Diagnosis not present

## 2023-04-12 DIAGNOSIS — E88819 Insulin resistance, unspecified: Secondary | ICD-10-CM | POA: Diagnosis not present

## 2023-04-12 DIAGNOSIS — Z131 Encounter for screening for diabetes mellitus: Secondary | ICD-10-CM | POA: Diagnosis not present

## 2023-04-12 DIAGNOSIS — R5383 Other fatigue: Secondary | ICD-10-CM | POA: Diagnosis not present

## 2023-04-12 LAB — COMPREHENSIVE METABOLIC PANEL
ALT: 36 U/L (ref 0–53)
AST: 27 U/L (ref 0–37)
Albumin: 4.3 g/dL (ref 3.5–5.2)
Alkaline Phosphatase: 50 U/L (ref 39–117)
BUN: 19 mg/dL (ref 6–23)
CO2: 29 meq/L (ref 19–32)
Calcium: 9.9 mg/dL (ref 8.4–10.5)
Chloride: 101 meq/L (ref 96–112)
Creatinine, Ser: 0.94 mg/dL (ref 0.40–1.50)
GFR: 89.26 mL/min (ref >60.00)
Glucose, Bld: 119 mg/dL — ABNORMAL HIGH (ref 70–99)
Potassium: 4.5 meq/L (ref 3.5–5.1)
Sodium: 138 meq/L (ref 135–145)
Total Bilirubin: 0.9 mg/dL (ref 0.2–1.2)
Total Protein: 7 g/dL (ref 6.0–8.3)

## 2023-04-12 LAB — TSH: TSH: 1.11 u[IU]/mL (ref 0.35–5.50)

## 2023-04-12 LAB — HEMOGLOBIN A1C: Hgb A1c MFr Bld: 6 % (ref 4.6–6.5)

## 2023-04-12 LAB — CBC WITH DIFFERENTIAL/PLATELET
Basophils Absolute: 0 10*3/uL (ref 0.0–0.1)
Basophils Relative: 0.7 % (ref 0.0–3.0)
Eosinophils Absolute: 0.1 10*3/uL (ref 0.0–0.7)
Eosinophils Relative: 1.4 % (ref 0.0–5.0)
HCT: 42 % (ref 39.0–52.0)
Hemoglobin: 14.1 g/dL (ref 13.0–17.0)
Lymphocytes Relative: 30 % (ref 12.0–46.0)
Lymphs Abs: 1.5 10*3/uL (ref 0.7–4.0)
MCHC: 33.7 g/dL (ref 30.0–36.0)
MCV: 98.8 fl (ref 78.0–100.0)
Monocytes Absolute: 0.6 10*3/uL (ref 0.1–1.0)
Monocytes Relative: 12.1 % — ABNORMAL HIGH (ref 3.0–12.0)
Neutro Abs: 2.7 10*3/uL (ref 1.4–7.7)
Neutrophils Relative %: 55.8 % (ref 43.0–77.0)
Platelets: 210 10*3/uL (ref 150.0–400.0)
RBC: 4.25 Mil/uL (ref 4.22–5.81)
RDW: 12.5 % (ref 11.5–15.5)
WBC: 4.9 10*3/uL (ref 4.0–10.5)

## 2023-04-12 LAB — LIPID PANEL
Cholesterol: 222 mg/dL — ABNORMAL HIGH (ref 0–200)
HDL: 47.5 mg/dL (ref >39.00)
LDL Cholesterol: 119 mg/dL — ABNORMAL HIGH (ref 0–99)
NonHDL: 174.43
Total CHOL/HDL Ratio: 5
Triglycerides: 278 mg/dL — ABNORMAL HIGH (ref 0.0–149.0)
VLDL: 55.6 mg/dL — ABNORMAL HIGH (ref 0.0–40.0)

## 2023-04-12 LAB — TESTOSTERONE: Testosterone: 232.81 ng/dL — ABNORMAL LOW (ref 300.00–890.00)

## 2023-04-12 LAB — PSA: PSA: 1.04 ng/mL (ref 0.10–4.00)

## 2023-05-21 ENCOUNTER — Other Ambulatory Visit: Payer: Self-pay | Admitting: Family Medicine

## 2023-08-10 ENCOUNTER — Other Ambulatory Visit: Payer: Self-pay | Admitting: Family Medicine

## 2023-10-13 ENCOUNTER — Encounter: Payer: Self-pay | Admitting: Family Medicine

## 2023-10-13 ENCOUNTER — Ambulatory Visit: Payer: 59 | Admitting: Family Medicine

## 2023-10-13 VITALS — BP 130/70 | HR 64 | Temp 97.0°F | Ht 72.0 in | Wt 207.6 lb

## 2023-10-13 DIAGNOSIS — N529 Male erectile dysfunction, unspecified: Secondary | ICD-10-CM

## 2023-10-13 DIAGNOSIS — R6882 Decreased libido: Secondary | ICD-10-CM | POA: Diagnosis not present

## 2023-10-13 DIAGNOSIS — E785 Hyperlipidemia, unspecified: Secondary | ICD-10-CM

## 2023-10-13 DIAGNOSIS — R7989 Other specified abnormal findings of blood chemistry: Secondary | ICD-10-CM | POA: Diagnosis not present

## 2023-10-13 DIAGNOSIS — E88819 Insulin resistance, unspecified: Secondary | ICD-10-CM

## 2023-10-13 DIAGNOSIS — Z131 Encounter for screening for diabetes mellitus: Secondary | ICD-10-CM | POA: Diagnosis not present

## 2023-10-13 DIAGNOSIS — I10 Essential (primary) hypertension: Secondary | ICD-10-CM | POA: Diagnosis not present

## 2023-10-13 DIAGNOSIS — M1A00X0 Idiopathic chronic gout, unspecified site, without tophus (tophi): Secondary | ICD-10-CM

## 2023-10-13 LAB — FOLLICLE STIMULATING HORMONE: FSH: 6.3 m[IU]/mL (ref 1.4–18.1)

## 2023-10-13 LAB — LIPID PANEL
Cholesterol: 205 mg/dL — ABNORMAL HIGH (ref 0–200)
HDL: 51.4 mg/dL (ref >39.00)
LDL Cholesterol: 113 mg/dL — ABNORMAL HIGH (ref 0–99)
NonHDL: 153.84
Total CHOL/HDL Ratio: 4
Triglycerides: 205 mg/dL — ABNORMAL HIGH (ref 0.0–149.0)
VLDL: 41 mg/dL — ABNORMAL HIGH (ref 0.0–40.0)

## 2023-10-13 LAB — LUTEINIZING HORMONE: LH: 3.85 m[IU]/mL (ref 1.50–9.30)

## 2023-10-13 LAB — HEMOGLOBIN A1C: Hgb A1c MFr Bld: 6.1 % (ref 4.6–6.5)

## 2023-10-13 MED ORDER — TRIAMCINOLONE ACETONIDE 0.1 % EX OINT: 1.0000 | TOPICAL_OINTMENT | Freq: Two times a day (BID) | CUTANEOUS | 5 refills | Status: AC

## 2023-10-13 MED ORDER — TADALAFIL 20 MG PO TABS: 10.0000 mg | ORAL_TABLET | ORAL | 11 refills | Status: AC | PRN

## 2023-10-13 NOTE — Patient Instructions (Addendum)
 [facility] contact Please call to schedule visit and/or procedure Address: [address] [place], [place], [place] [address] Phone: [phone]   Lets cut back on food intake - myfitnesspal is helpful tool if you need one  Please stop by lab before you go If you have mychart- we will send your results within 3 business days of [place] receiving them.  If you do not have mychart- we will call you about results within 5 business days of [place] receiving them.  *please also note that you will see labs on mychart as soon as they post. I will later go in and write notes on them- will say "notes from Dr. Durene Cal"   Recommended follow up: Return in about 6 months (around 04/14/2024) for physical or sooner if needed.Schedule b4 you leave.

## 2023-10-13 NOTE — Progress Notes (Signed)
 Phone [phone] In person visit   Subjective:   Cody Woodard is a 58 y.o. year old very pleasant male patient who presents for/with See problem oriented charting Chief Complaint  Patient presents with   Medical Management of Chronic Issues   Hypertension    Past Medical History-  Patient Active Problem List   Diagnosis Date Noted   Essential hypertension 07/20/2010    Priority: Medium    Chronic gouty arthropathy 02/26/2010    Priority: Medium    Hyperlipidemia 11/08/2007    Priority: Medium    Insulin resistance 03/30/2018    Priority: Low   Adenomatous colon polyp 04/03/2015    Priority: Low   Former smoker 01/21/2015    Priority: Low   Back pain with radiation 01/19/2009    Priority: Low   Psoriasis 02/14/2008    Priority: Low    Medications- reviewed and updated Current Outpatient Medications  Medication Sig Dispense Refill   amLODipine (NORVASC) 10 MG tablet Take 1 tablet (10 mg total) by mouth daily. 90 tablet 3   atorvastatin (LIPITOR) 20 MG tablet Take 1 tablet (20 mg total) by mouth daily. 90 tablet 3   betamethasone, augmented, (DIPROLENE) 0.05 % lotion      bisoprolol-hydrochlorothiazide (ZIAC) 5-6.25 MG tablet TAKE 1 TABLET BY MOUTH DAILY 90 tablet 3   clobetasol (TEMOVATE) 0.05 % external solution APPLY ONTO THE SKIN/SCALP DAILY AS DIRECTED BY DOCTOR **MAX QTY 25 PER INS**     Colchicine (MITIGARE) 0.6 MG CAPS Take 2 pills at first sign of gout flare, then take 1 pill 2 hours later if pain persists. 1 pill daily thereafter until flare resolves 30 capsule 5   febuxostat (ULORIC) 40 MG tablet TAKE 1 TABLET BY MOUTH DAILY 90 tablet 3   naproxen sodium (ANAPROX) 220 MG tablet Take 220 mg by mouth as needed.     tadalafil (CIALIS) 20 MG tablet Take 0.5-1 tablets (10-20 mg total) by mouth every other day as needed for erectile dysfunction. 10 tablet 11   triamcinolone ointment (KENALOG) 0.1 % Apply 1 Application topically 2 (two) times daily. For psoriasis  (avoid on the face) 80 g 5   No current facility-administered medications for this visit.     Objective:  BP 130/70   Pulse 64   Temp (!) 97 F (36.1 C)   Ht 6' (1.829 m)   Wt 207 lb 9.6 oz (94.2 kg)   SpO2 96%   BMI 28.16 kg/m  Gen: NAD, resting comfortably CV: RRR no murmurs rubs or gallops Lungs: CTAB no crackles, wheeze, rhonchi Ext: trace edema Skin: warm, dry     Assessment and Plan   #Hypertension S: Compliant with Ziac 5-6.25 mg, amlodipine 5 mg - weight up 3 lbs and 10 lbs from 18 months ago. Wants to reduce portions and get more active Wt Readings from Last 3 Encounters:  10/13/23 207 lb 9.6 oz (94.2 kg)  04/07/23 204 lb 12.8 oz (92.9 kg)  03/15/22 197 lb 9.6 oz (89.6 kg)  A/P: well controlled continue current medications    #Gout S: Compliant with Uloric 40 mg even if just every other day.  Uses colchicine as needed.   Aware of risks increased on hydrochlorothiazide.  Never on allopurinol. A/P: no flares- wants to hold off on uric acid repeat and continue current medications     #Hyperlipidemia S: Compliant with simvastatin 40 mg--> atorvastatin 20 mg Lab Results  Component Value Date   CHOL 222 (H) 04/12/2023   HDL  47.50 04/12/2023   LDLCALC 119 (H) 04/12/2023   LDLDIRECT 97.0 03/15/2022   TRIG 278.0 (H) 04/12/2023   CHOLHDL 5 04/12/2023   A/P: hopefully improve-D update lipid panel today  #Insulin resistance/hyperglycemia S: Fasting CBG slightly elevated Lab Results  Component Value Date   HGBA1C 6.0 04/12/2023   HGBA1C 6.0 03/15/2022   HGBA1C 5.9 09/01/2020  A/P: discussed healthy eating and regular exercise as well as statin risk for prediabetes - update a1c today    #Psoriasis S: Uses as needed steroid cream from dermatology.  Has seen rheumatology in the past- [facility] arthritis thought to be OA as expected and not psoriatic arthritis  A/P: working with dermatology- continue to monitor and close follow up with them   # Some  fatigue-testosterone was low 04/07/2023 -some decreased libido and longevity issues with erection as well  -also feels like general interest lower - we will recheck testosterone   Recommended follow up: Return in about 6 months (around 04/14/2024) for physical or sooner if needed.Schedule b4 you leave.  Lab/Order associations: fasting   ICD-10-CM   1. Essential hypertension  I10     2. Hyperlipidemia, unspecified hyperlipidemia type  E78.5 Lipid panel    3. Insulin resistance  E88.819 Hemoglobin A1c    4. Screening for diabetes mellitus  Z13.1 Hemoglobin A1c    5. Chronic gouty arthropathy  M1A.00X0     6. Low testosterone  R79.89 Follicle stimulating hormone    Luteinizing hormone    Testosterone Total,Free,Bio, Males    7. Low libido  R68.82 Follicle stimulating hormone    Luteinizing hormone    Testosterone Total,Free,Bio, Males    8. Erectile dysfunction, unspecified erectile dysfunction type  N52.9 Follicle stimulating hormone    Luteinizing hormone    Testosterone Total,Free,Bio, Males      Meds ordered this encounter  Medications   tadalafil (CIALIS) 20 MG tablet    Sig: Take 0.5-1 tablets (10-20 mg total) by mouth every other day as needed for erectile dysfunction.    Dispense:  10 tablet    Refill:  11   triamcinolone ointment (KENALOG) 0.1 %    Sig: Apply 1 Application topically 2 (two) times daily. For psoriasis (avoid on the face)    Dispense:  80 g    Refill:  5    Return precautions advised.  Tana Conch, MD

## 2023-10-14 LAB — TESTOSTERONE TOTAL,FREE,BIO, MALES
Albumin: 4.5 g/dL (ref 3.6–5.1)
Sex Hormone Binding: 22 nmol/L (ref 22–77)
Testosterone: 221 ng/dL — ABNORMAL LOW (ref 250–827)

## 2023-10-16 ENCOUNTER — Other Ambulatory Visit: Payer: Self-pay | Admitting: Family Medicine

## 2023-10-16 ENCOUNTER — Encounter: Payer: Self-pay | Admitting: Family Medicine

## 2023-10-16 DIAGNOSIS — R7989 Other specified abnormal findings of blood chemistry: Secondary | ICD-10-CM

## 2023-10-19 ENCOUNTER — Other Ambulatory Visit (INDEPENDENT_AMBULATORY_CARE_PROVIDER_SITE_OTHER)

## 2023-10-19 ENCOUNTER — Encounter: Payer: Self-pay | Admitting: Family Medicine

## 2023-10-19 DIAGNOSIS — R7989 Other specified abnormal findings of blood chemistry: Secondary | ICD-10-CM

## 2023-10-19 LAB — IBC + FERRITIN
Ferritin: 338.5 ng/mL — ABNORMAL HIGH (ref 22.0–322.0)
Iron: 122 ug/dL (ref 42–165)
Saturation Ratios: 32.5 % (ref 20.0–50.0)
TIBC: 375.2 ug/dL (ref 250.0–450.0)
Transferrin: 268 mg/dL (ref 212.0–360.0)

## 2023-10-19 LAB — T4, FREE: Free T4: 0.88 ng/dL (ref 0.60–1.60)

## 2023-10-19 LAB — CORTISOL: Cortisol, Plasma: 13.6 ug/dL

## 2023-10-20 ENCOUNTER — Encounter: Payer: Self-pay | Admitting: Family Medicine

## 2023-10-20 ENCOUNTER — Other Ambulatory Visit: Payer: Self-pay | Admitting: Family Medicine

## 2023-10-20 LAB — PROLACTIN: Prolactin: 5.7 ng/mL (ref 2.0–18.0)

## 2024-04-30 ENCOUNTER — Ambulatory Visit (INDEPENDENT_AMBULATORY_CARE_PROVIDER_SITE_OTHER): Admitting: Family Medicine

## 2024-04-30 ENCOUNTER — Encounter: Payer: Self-pay | Admitting: Family Medicine

## 2024-04-30 ENCOUNTER — Ambulatory Visit: Payer: Self-pay | Admitting: Family Medicine

## 2024-04-30 VITALS — BP 138/88 | HR 57 | Temp 97.5°F | Ht 72.0 in | Wt 197.0 lb

## 2024-04-30 DIAGNOSIS — Z23 Encounter for immunization: Secondary | ICD-10-CM | POA: Diagnosis not present

## 2024-04-30 DIAGNOSIS — Z131 Encounter for screening for diabetes mellitus: Secondary | ICD-10-CM

## 2024-04-30 DIAGNOSIS — E88819 Insulin resistance, unspecified: Secondary | ICD-10-CM | POA: Diagnosis not present

## 2024-04-30 DIAGNOSIS — Z0000 Encounter for general adult medical examination without abnormal findings: Secondary | ICD-10-CM

## 2024-04-30 DIAGNOSIS — M1A9XX0 Chronic gout, unspecified, without tophus (tophi): Secondary | ICD-10-CM

## 2024-04-30 DIAGNOSIS — E785 Hyperlipidemia, unspecified: Secondary | ICD-10-CM

## 2024-04-30 DIAGNOSIS — R7989 Other specified abnormal findings of blood chemistry: Secondary | ICD-10-CM

## 2024-04-30 DIAGNOSIS — Z125 Encounter for screening for malignant neoplasm of prostate: Secondary | ICD-10-CM | POA: Diagnosis not present

## 2024-04-30 DIAGNOSIS — I10 Essential (primary) hypertension: Secondary | ICD-10-CM

## 2024-04-30 DIAGNOSIS — Z87891 Personal history of nicotine dependence: Secondary | ICD-10-CM | POA: Diagnosis not present

## 2024-04-30 LAB — LIPID PANEL
Cholesterol: 215 mg/dL — ABNORMAL HIGH (ref 0–200)
HDL: 61.3 mg/dL (ref >39.00)
LDL Cholesterol: 123 mg/dL — ABNORMAL HIGH (ref 0–99)
NonHDL: 153.75
Total CHOL/HDL Ratio: 4
Triglycerides: 155 mg/dL — ABNORMAL HIGH (ref 0.0–149.0)
VLDL: 31 mg/dL (ref 0.0–40.0)

## 2024-04-30 LAB — COMPREHENSIVE METABOLIC PANEL WITH GFR
ALT: 26 U/L (ref 0–53)
AST: 23 U/L (ref 0–37)
Albumin: 4.7 g/dL (ref 3.5–5.2)
Alkaline Phosphatase: 45 U/L (ref 39–117)
BUN: 17 mg/dL (ref 6–23)
CO2: 30 meq/L (ref 19–32)
Calcium: 10.4 mg/dL (ref 8.4–10.5)
Chloride: 99 meq/L (ref 96–112)
Creatinine, Ser: 0.9 mg/dL (ref 0.40–1.50)
GFR: 93.35 mL/min (ref >60.00)
Glucose, Bld: 127 mg/dL — ABNORMAL HIGH (ref 70–99)
Potassium: 4.3 meq/L (ref 3.5–5.1)
Sodium: 138 meq/L (ref 135–145)
Total Bilirubin: 0.8 mg/dL (ref 0.2–1.2)
Total Protein: 7.3 g/dL (ref 6.0–8.3)

## 2024-04-30 LAB — URINALYSIS, ROUTINE W REFLEX MICROSCOPIC
Bilirubin Urine: NEGATIVE
Hgb urine dipstick: NEGATIVE
Ketones, ur: NEGATIVE
Leukocytes,Ua: NEGATIVE
Nitrite: NEGATIVE
RBC / HPF: NONE SEEN (ref 0–?)
Specific Gravity, Urine: 1.01 (ref 1.000–1.030)
Total Protein, Urine: NEGATIVE
Urine Glucose: NEGATIVE
Urobilinogen, UA: 0.2 (ref 0.0–1.0)
pH: 7 (ref 5.0–8.0)

## 2024-04-30 LAB — CBC WITH DIFFERENTIAL/PLATELET
Basophils Absolute: 0 K/uL (ref 0.0–0.1)
Basophils Relative: 0.5 % (ref 0.0–3.0)
Eosinophils Absolute: 0.1 K/uL (ref 0.0–0.7)
Eosinophils Relative: 1.8 % (ref 0.0–5.0)
HCT: 43.5 % (ref 39.0–52.0)
Hemoglobin: 14.9 g/dL (ref 13.0–17.0)
Lymphocytes Relative: 26.7 % (ref 12.0–46.0)
Lymphs Abs: 1.6 K/uL (ref 0.7–4.0)
MCHC: 34.2 g/dL (ref 30.0–36.0)
MCV: 95.3 fl (ref 78.0–100.0)
Monocytes Absolute: 0.7 K/uL (ref 0.1–1.0)
Monocytes Relative: 11.2 % (ref 3.0–12.0)
Neutro Abs: 3.5 K/uL (ref 1.4–7.7)
Neutrophils Relative %: 59.8 % (ref 43.0–77.0)
Platelets: 211 K/uL (ref 150.0–400.0)
RBC: 4.56 Mil/uL (ref 4.22–5.81)
RDW: 13 % (ref 11.5–15.5)
WBC: 5.9 K/uL (ref 4.0–10.5)

## 2024-04-30 LAB — PSA: PSA: 1.48 ng/mL (ref 0.10–4.00)

## 2024-04-30 LAB — URIC ACID: Uric Acid, Serum: 5.4 mg/dL (ref 4.0–7.8)

## 2024-04-30 LAB — HEMOGLOBIN A1C: Hgb A1c MFr Bld: 6.3 % (ref 4.6–6.5)

## 2024-04-30 NOTE — Progress Notes (Signed)
 Phone: [phone]   Subjective:  Patient presents today for their annual physical. Chief complaint-noted.   See problem oriented charting- ROS- full  review of systems was completed and negative  except for topics noted under acute/chronic concerns  The following were reviewed and entered/updated in epic: Past Medical History:  Diagnosis Date   ALLERGIC RHINITIS 11/08/2007   Blood transfusion without reported diagnosis    Gouty arthropathy 01/15/2008   HYPERLIPIDEMIA 11/08/2007   HYPERTENSION, ESSENTIAL, UNCONTROLLED 07/20/2010   Olecranon bursitis of left elbow 02/16/2011   Improved now- seen Dr. Harvey in the past.  Dr. Genevive is chronic and has been there for years. It rarely flares or causes him problems. He thinks this was traumatic and not related to his gout    PSORIASIS, SCALP 02/14/2008   SCIATICA, CHRONIC 01/19/2009   TENDINITIS, LEFT ELBOW 03/15/2010   Patient Active Problem List   Diagnosis Date Noted   Essential hypertension 07/20/2010    Priority: Medium    Chronic gouty arthropathy 02/26/2010    Priority: Medium    Hyperlipidemia 11/08/2007    Priority: Medium    Insulin resistance 03/30/2018    Priority: Low   Adenomatous colon polyp 04/03/2015    Priority: Low   Former smoker 01/21/2015    Priority: Low   Back pain with radiation 01/19/2009    Priority: Low   Psoriasis 02/14/2008    Priority: Low   Past Surgical History:  Procedure Laterality Date   CATARACT EXTRACTION, BILATERAL     ear pinning     at age 27   EYE SURGERY  2021   Cateract Surgery both eyes   LACERATION REPAIR     through window age 89   tendon repari     through window age 72    Family History  Adopted: Yes    Medications- reviewed and updated Current Outpatient Medications  Medication Sig Dispense Refill   amLODipine  (NORVASC ) 10 MG tablet Take 1 tablet (10 mg total) by mouth daily. 90 tablet 3   atorvastatin  (LIPITOR) 20 MG tablet Take 1 tablet (20 mg total) by mouth  daily. 90 tablet 3   betamethasone, augmented, (DIPROLENE) 0.05 % lotion      bisoprolol -hydrochlorothiazide  (ZIAC ) 5-6.25 MG tablet TAKE 1 TABLET BY MOUTH DAILY 90 tablet 3   clobetasol (TEMOVATE) 0.05 % external solution APPLY ONTO THE SKIN/SCALP DAILY AS DIRECTED BY DOCTOR **MAX QTY 25 PER INS**     Colchicine  (MITIGARE ) 0.6 MG CAPS Take 2 pills at first sign of gout flare, then take 1 pill 2 hours later if pain persists. 1 pill daily thereafter until flare resolves 30 capsule 5   febuxostat  (ULORIC ) 40 MG tablet TAKE 1 TABLET BY MOUTH DAILY 90 tablet 3   naproxen sodium (ANAPROX) 220 MG tablet Take 220 mg by mouth as needed.     tadalafil  (CIALIS ) 20 MG tablet Take 0.5-1 tablets (10-20 mg total) by mouth every other day as needed for erectile dysfunction. 10 tablet 11   triamcinolone  ointment (KENALOG ) 0.1 % Apply 1 Application topically 2 (two) times daily. For psoriasis (avoid on the face) 80 g 5   No current facility-administered medications for this visit.    Allergies-reviewed and updated No Known Allergies  Social History   Social History Narrative   Married ( wife patient here as well). Son in [place] 35 - 2 grandkids and 1 on the way in 2023.       Writes software      Hobbies:  golfing, music, time with family/friends-enjoying food   Objective  Objective:  BP 138/88   Pulse (!) 57   Temp (!) 97.5 F (36.4 C)   Ht 6' (1.829 m)   Wt 197 lb (89.4 kg)   SpO2 98%   BMI 26.72 kg/m  Gen: NAD, resting comfortably HEENT: Mucous membranes are moist. Oropharynx normal Neck: no thyromegaly CV: RRR no murmurs rubs or gallops Lungs: CTAB no crackles, wheeze, rhonchi Abdomen: soft/nontender/nondistended/normal bowel sounds. No rebound or guarding.  Ext: no edema Skin: warm, dry Neuro: grossly normal, moves all extremities, PERRLA   Assessment and Plan  58 y.o. male presenting for annual physical.  Health Maintenance counseling: 1. Anticipatory guidance: Patient counseled  regarding regular dental exams -q3 months, eye exams - yearly,  avoiding smoking and second hand smoke , limiting alcohol to 2 beverages per day - about 21 a week still- encouraged to decrease, no illicit drugs .   2. Risk factor reduction:  Advised patient of need for regular exercise and diet rich and fruits and vegetables to reduce risk of heart attack and stroke.  Exercise- weights still 3 days a week, still some walking, also does some core and push ups. Steps avg down to 7000 from 10000.  Diet/weight management-down 10 lbs- reduced caloric intake- smaller dinner, less for breakfast, water still .  Wt Readings from Last 3 Encounters:  04/30/24 197 lb (89.4 kg)  10/13/23 207 lb 9.6 oz (94.2 kg)  04/07/23 204 lb 12.8 oz (92.9 kg)   3. Immunizations/screenings/ancillary studies- flu shot today, consider fall COVID shot, Prevnar 20 plans at 65 Immunization History  Administered Date(s) Administered   Influenza Split 06/13/2012   Influenza, Seasonal, Injecte, Preservative Fre 04/07/2023, 04/30/2024   Influenza,inj,Quad PF,6+ Mos 04/28/2016, 07/10/2017, 03/30/2018, 04/04/2019, 07/10/2020, 06/16/2021   PFIZER(Purple Top)SARS-COV-2 Vaccination 10/24/2019, 11/18/2019, 07/10/2020   Pfizer Covid-19 Vaccine Bivalent Booster 37yrs & up 06/16/2021   Td 01/06/2001   Tdap 03/11/2011, 03/15/2022   Zoster Recombinant(Shingrix ) 10/01/2018, 03/07/2019  4. Prostate cancer screening- low risk prior trend- update psa today   Lab Results  Component Value Date   PSA 1.04 04/12/2023   PSA 1.07 03/15/2022   PSA 1.11 04/04/2019   5. Colon cancer screening - August 2016 and had polyp history- referred last 2 years- encouraged again this year. Hard to find goo dtime 6. Skin cancer screening- new dermatologist as needed. advised regular sunscreen use. Denies worrisome, changing, or new skin lesions. Sees them for psoriasis 7. Smoking associated screening (lung cancer screening, AAA screen 65-75, UA)- former  smoker- quit 2009. Abdominal aortic aneurysm screen at 65. Previously opted out of lung cancer screening. Opts in for urine 8. STD screening - only active with wife  Status of chronic or acute concerns   #Hypertension S: Compliant with Ziac  5-6.25 mg, amlodipine  10 mg -130 on lower end at home- mostly 130s though  BP Readings from Last 3 Encounters:  04/30/24 138/88  10/13/23 130/70  04/07/23 (!) 144/84  A/P: high acceptable but runs lower at home- we opted to hold steady- continue current medications    #Gout S: Compliant with Uloric  40 mg even if just every other day in past- but went up to daily more recently.  Uses colchicine  as needed.  Uric acid level goal under 6 typically.  Aware of risks increased on hydrochlorothiazide .  Never on allopurinol. -twinges but no clear flares Lab Results  Component Value Date   LABURIC 7.3 09/01/2020  A/P: update uric acid with more  consistent use of uloric  to see if closer to goal 6    #Hyperlipidemia S: Compliant with simvastatin  40 mg--> atorvastatin  20 mg  Lab Results  Component Value Date   CHOL 205 (H) 10/13/2023   HDL 51.40 10/13/2023   LDLCALC 113 (H) 10/13/2023   LDLDIRECT 97.0 03/15/2022   TRIG 205.0 (H) 10/13/2023   CHOLHDL 4 10/13/2023   A/P: LDL at 113 last check- ideally would be under 70 in perfect world but stronger statins can further increase diabetes risk so we are cautious about increase  #Insulin resistance/hyperglycemia S: Fasting CBG slightly elevated Lab Results  Component Value Date   HGBA1C 6.1 10/13/2023   HGBA1C 6.0 04/12/2023   HGBA1C 6.0 03/15/2022  A/P: update a1c with labs- great job with weight loss!     #Psoriasis S: Uses as needed steroid cream from dermatology.  Has seen rheumatology in the past- [facility] arthritis thought to be OA as expected and not psoriatic arthritis  Sees derm A/P: under reasonable control   # Some fatigue-testosterone  was low 04/07/2023 -some decreased libido and longevity  issues with erection as well  -also feels like general interest lower -offered repeat today but would not be interested in medications and was costly $400 previously so hold off  Recommended follow up: Return in about 6 months (around 10/28/2024) for followup or sooner if needed.Schedule b4 you leave.  Lab/Order associations: fasting   ICD-10-CM   1. Flu vaccine need  Z23 Flu vaccine trivalent PF, 6mos and older(Flulaval,Afluria,Fluarix,Fluzone)    2. Preventative health care  Z00.00     3. Hyperlipidemia, unspecified hyperlipidemia type  E78.5 Comprehensive metabolic panel with GFR    CBC with Differential/Platelet    Lipid panel    4. Chronic gouty arthropathy  M1A.9XX0 Uric acid    5. Essential hypertension  I10     6. Former smoker  Z87.891 Urinalysis, Routine w reflex microscopic    7. Insulin resistance  E88.819 Hemoglobin A1c    8. Screening for diabetes mellitus  Z13.1 Hemoglobin A1c    9. Screening for prostate cancer  Z12.5 PSA    10. Low testosterone   R79.89       No orders of the defined types were placed in this encounter.   Return precautions advised.  Garnette Lukes, MD

## 2024-04-30 NOTE — Patient Instructions (Addendum)
 [facility] contact Please call to schedule visit and/or procedure IF you do not hear within a week Address: [address] [place], [place], [place] [address] Phone: [phone]   Please stop by lab before you go If you have mychart- we will send your results within 3 business days of us  receiving them.  If you do not have mychart- we will call you about results within 5 business days of us  receiving them.  *please also note that you will see labs on mychart as soon as they post. I will later go in and write notes on them- will say notes from Dr. Katrinka   Recommended follow up: Return in about 6 months (around 10/28/2024) for followup or sooner if needed.Schedule b4 you leave. If blood pressure looks great and you want to hold off on a1c check- at least see me yearly for physical

## 2024-05-26 ENCOUNTER — Other Ambulatory Visit: Payer: Self-pay | Admitting: Family Medicine

## 2024-10-30 ENCOUNTER — Ambulatory Visit: Admitting: Family Medicine

## 2025-05-06 ENCOUNTER — Encounter: Admitting: Family Medicine
